# Patient Record
Sex: Female | Born: 2000 | Race: Black or African American | Hispanic: No | Marital: Single | Smoking: Never smoker
Health system: Southern US, Community
[De-identification: ages and names within clinical notes are randomized; demographics above are authoritative.]

## PROBLEM LIST (undated history)

## (undated) DIAGNOSIS — N926 Irregular menstruation, unspecified: Principal | ICD-10-CM

## (undated) DIAGNOSIS — Z975 Presence of (intrauterine) contraceptive device: Secondary | ICD-10-CM

## (undated) DIAGNOSIS — O139 Gestational [pregnancy-induced] hypertension without significant proteinuria, unspecified trimester: Secondary | ICD-10-CM

## (undated) HISTORY — DX: Irregular menstruation, unspecified: N92.6

## (undated) HISTORY — DX: Presence of (intrauterine) contraceptive device: Z97.5

## (undated) HISTORY — PX: NO PAST SURGERIES: SHX2092

---
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
## 2019-09-22 ENCOUNTER — Encounter: Payer: Self-pay | Admitting: Adult Health

## 2019-09-22 ENCOUNTER — Other Ambulatory Visit (HOSPITAL_COMMUNITY)
Admission: RE | Admit: 2019-09-22 | Discharge: 2019-09-22 | Disposition: A | Discharge status: Home / Self Care | Payer: Medicaid Other | Source: Ambulatory Visit | Attending: Adult Health | Admitting: Adult Health

## 2019-09-22 ENCOUNTER — Ambulatory Visit (INDEPENDENT_AMBULATORY_CARE_PROVIDER_SITE_OTHER): Payer: Medicaid Other | Admitting: Adult Health

## 2019-09-22 ENCOUNTER — Other Ambulatory Visit: Payer: Self-pay

## 2019-09-22 VITALS — BP 127/72 | HR 72 | Ht 63.0 in | Wt 221.5 lb

## 2019-09-22 DIAGNOSIS — Z113 Encounter for screening for infections with a predominantly sexual mode of transmission: Secondary | ICD-10-CM | POA: Insufficient documentation

## 2019-09-22 NOTE — Progress Notes (Signed)
  Subjective:     Patient ID: Brittany Morton, female   DOB: [birth year], 19 y.o.   MRN: [id number]  HPI Lizanne is a 19 year old black female, single, G1P1 in requesting STD screening. PCP is [facility].   Review of Systems Denies any discharge, odor, itching or burning Reviewed past medical,surgical, social and family history. Reviewed medications and allergies.     Objective:   Physical Exam BP 127/72 (BP Location: Left Arm, Patient Position: Sitting, Cuff Size: Large)   Pulse 72   Ht 5\' 3"  (1.6 m)   Wt 221 lb 8 oz (100.5 kg)   LMP 08/30/2019 (Exact Date)   BMI 39.24 kg/m   Fall risk is moderate.assisted off the exam table.  Skin warm and dry.Pelvic: external genitalia is normal in appearance no lesions, vagina: scant white discharge without odor,urethra has no lesions or masses noted, cervix:smooth and bulbous, uterus: normal size, shape and contour, non tender, no masses felt, adnexa: no masses or tenderness noted. Bladder is non tender and no masses felt. CV swab obtained. Examination chaperoned by 10/28/2019 CMA.     Assessment:     STD screening CV swab sent Check HIV and RPR     Plan:    Return 10/02/2020 for first pap and physical  She declines birth control

## 2019-09-23 ENCOUNTER — Telehealth: Payer: Self-pay | Admitting: Adult Health

## 2019-09-23 ENCOUNTER — Encounter: Payer: Self-pay | Admitting: Adult Health

## 2019-09-23 DIAGNOSIS — A749 Chlamydial infection, unspecified: Secondary | ICD-10-CM

## 2019-09-23 HISTORY — DX: Chlamydial infection, unspecified: A74.9

## 2019-09-23 LAB — CERVICOVAGINAL ANCILLARY ONLY
Chlamydia: POSITIVE — AB
Comment: NEGATIVE
Comment: NEGATIVE
Comment: NORMAL
Neisseria Gonorrhea: NEGATIVE
Trichomonas: NEGATIVE

## 2019-09-23 LAB — HIV ANTIBODY (ROUTINE TESTING W REFLEX): HIV Screen 4th Generation wRfx: NONREACTIVE

## 2019-09-23 LAB — RPR: RPR Ser Ql: NONREACTIVE

## 2019-09-23 MED ORDER — AZITHROMYCIN 500 MG PO TABS: ORAL_TABLET | ORAL | 0 refills | Status: DC

## 2019-09-23 NOTE — Telephone Encounter (Signed)
Left message that CV swab +chlamydia , sent Rx in for azithromycin 500 mg #2 2 po now to [facility] and no sex, needs proof of cure in 4 weeks, call for appt, and tell partner he needs to be treated, [facility] sent

## 2019-10-21 ENCOUNTER — Other Ambulatory Visit (HOSPITAL_COMMUNITY)
Admission: RE | Admit: 2019-10-21 | Discharge: 2019-10-21 | Disposition: A | Discharge status: Home / Self Care | Payer: Medicaid Other | Source: Ambulatory Visit | Attending: Adult Health | Admitting: Adult Health

## 2019-10-21 ENCOUNTER — Encounter: Payer: Self-pay | Admitting: Adult Health

## 2019-10-21 ENCOUNTER — Ambulatory Visit (INDEPENDENT_AMBULATORY_CARE_PROVIDER_SITE_OTHER): Payer: Medicaid Other | Admitting: Adult Health

## 2019-10-21 ENCOUNTER — Other Ambulatory Visit: Payer: Self-pay

## 2019-10-21 VITALS — BP 123/86 | HR 74 | Ht 63.0 in | Wt 222.0 lb

## 2019-10-21 DIAGNOSIS — Z8619 Personal history of other infectious and parasitic diseases: Secondary | ICD-10-CM | POA: Diagnosis not present

## 2019-10-21 NOTE — Progress Notes (Signed)
  Subjective:     Patient ID: Brittany Morton, female   DOB: 04/19/2001, 19 y.o.   MRN: [id number]  HPI Brittany Morton is a 19 year old black female, single, G1P1, back in office for proof of cure for recent +chlamydia. PCP is [facility].   Review of Systems No sex since treatment  Reviewed past medical,surgical, social and family history. Reviewed medications and allergies.     Objective:   Physical Exam BP 123/86 (BP Location: Left Wrist, Patient Position: Sitting, Cuff Size: Normal)   Pulse 74   Ht 5\' 3"  (1.6 m)   Wt 222 lb (100.7 kg)   LMP 09/28/2019 (Exact Date)   BMI 39.33 kg/m   Skin warm and dry.Pelvic: external genitalia is normal in appearance no lesions, vagina: scant discharge without odor,urethra has no lesions or masses noted, cervix:smooth, uterus: normal size, shape and contour, non tender, no masses felt, adnexa: no masses or tenderness noted. Bladder is non tender and no masses felt.  CV swab obatined    Assessment:     1. History of chlamydia CV swab sent     Plan:     Return in 1 year for pap and physical If has sex use condoms

## 2019-10-24 LAB — CERVICOVAGINAL ANCILLARY ONLY
Chlamydia: NEGATIVE
Comment: NEGATIVE
Comment: NORMAL
Neisseria Gonorrhea: NEGATIVE

## 2020-06-16 IMAGING — CT CT ABD-PELV W/ CM
2 of 4 series · 17 of 46 positions shown, 19 images · IV contrast (Isovue)
Comparison: None.

CLINICAL DATA: 17 y/o F; abdominal pain with nausea, vomiting, and
diarrhea for 2 days.

EXAM:
CT ABDOMEN AND PELVIS WITH CONTRAST
TECHNIQUE: Multidetector CT imaging of the abdomen and pelvis was performed
using the standard protocol following bolus administration of
intravenous contrast.
CONTRAST:  100mL Q33D0A-REE IOPAMIDOL (Q33D0A-REE) INJECTION 61%

[Series 2: axial st · axial · 0.75mm/px · z∈[-509,-84]mm · 14 of 93 slices shown, 16 images]
[im 4/93  soft-tissue]
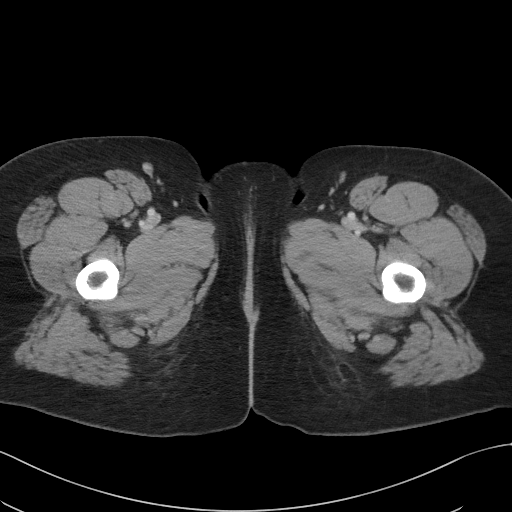
[im 4/93  bone]
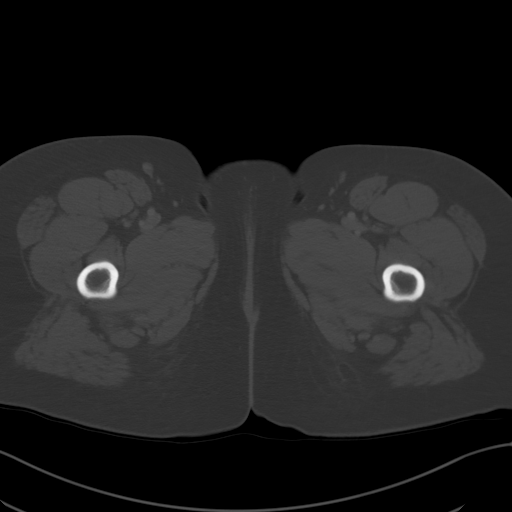
[im 12/93  soft-tissue]
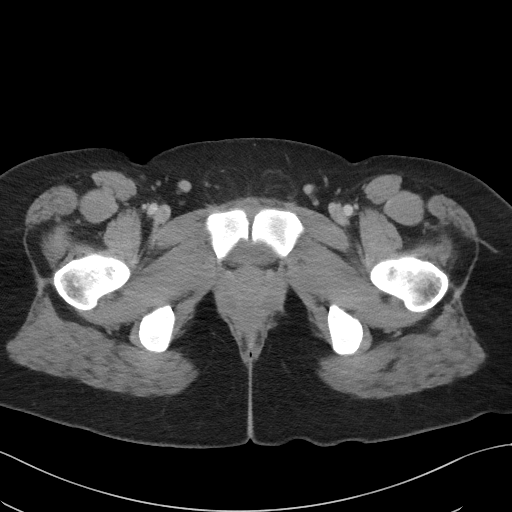
[im 19/93  soft-tissue]
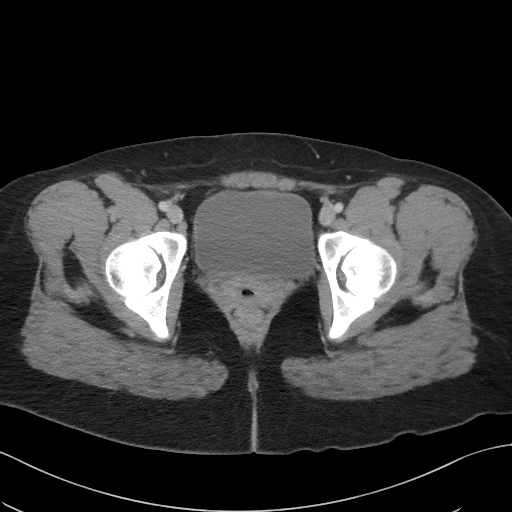
[im 26/93  soft-tissue]
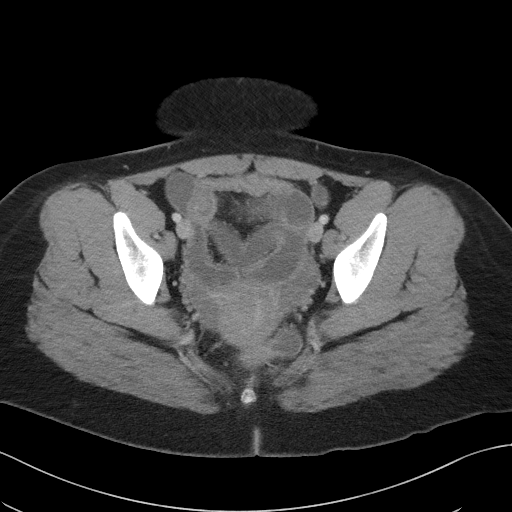
[im 30/93  soft-tissue]
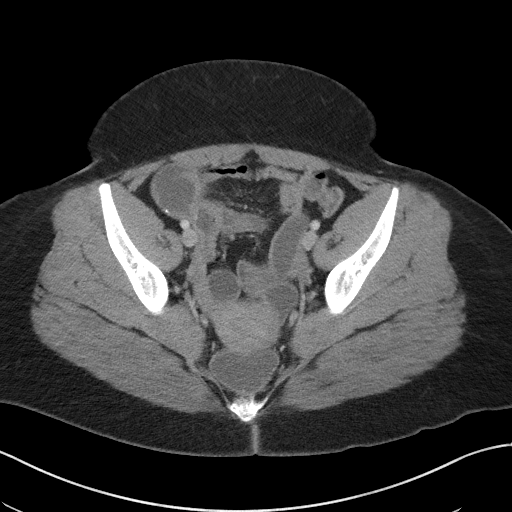
[im 37/93  soft-tissue]
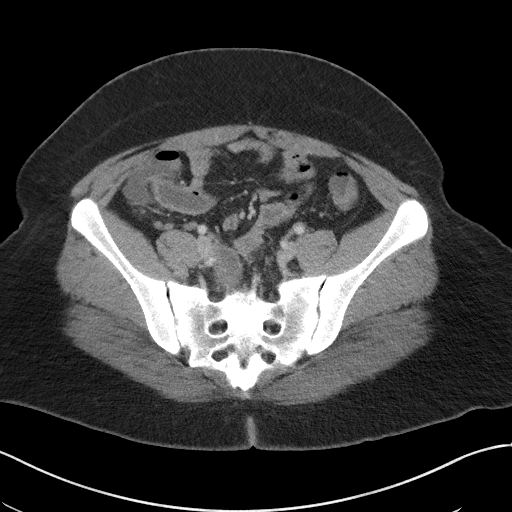
[im 45/93  soft-tissue]
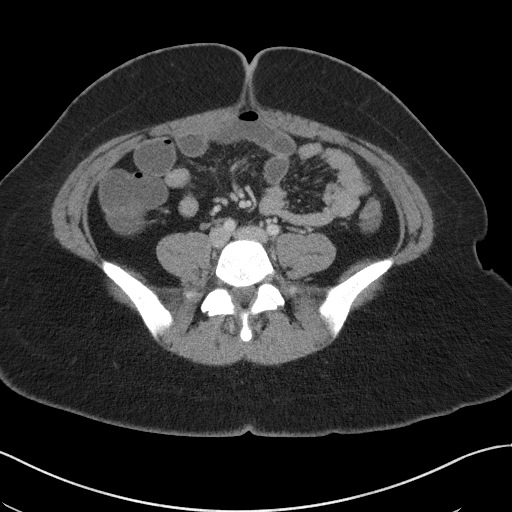
[im 48/93  soft-tissue]
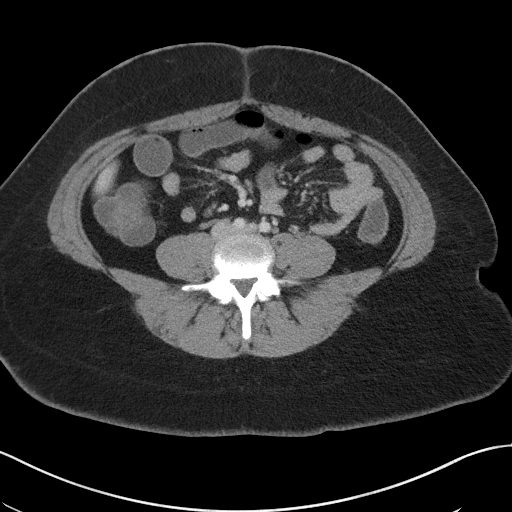
[im 56/93  soft-tissue]
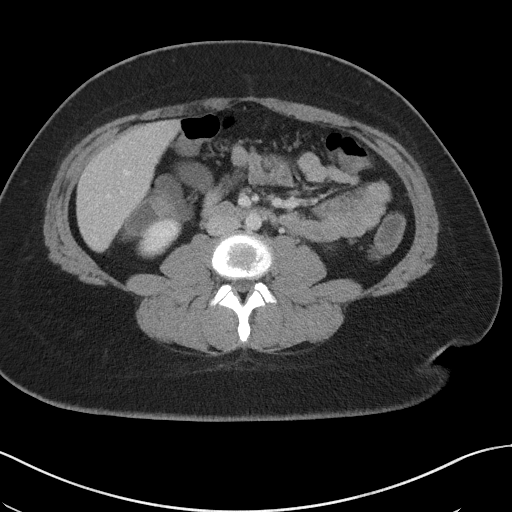
[im 56/93  bone]
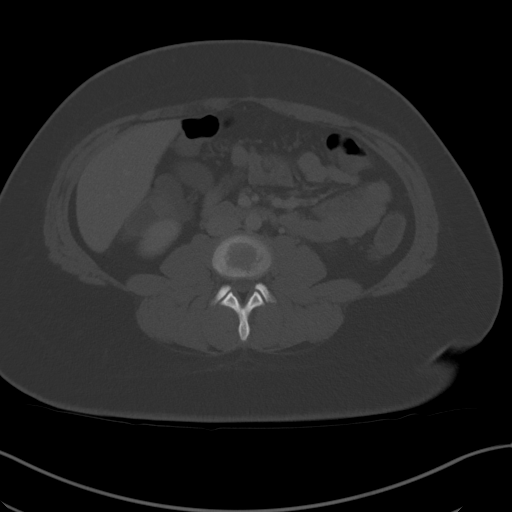
[im 63/93  soft-tissue]
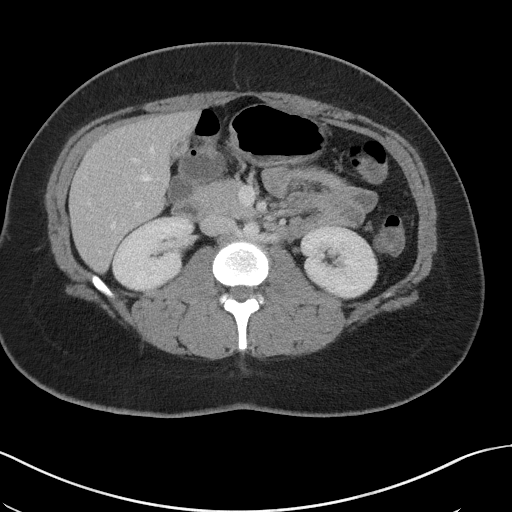
[im 70/93  soft-tissue]
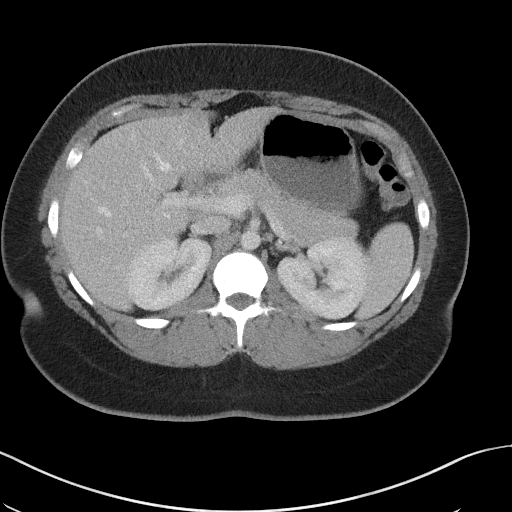
[im 74/93  soft-tissue]
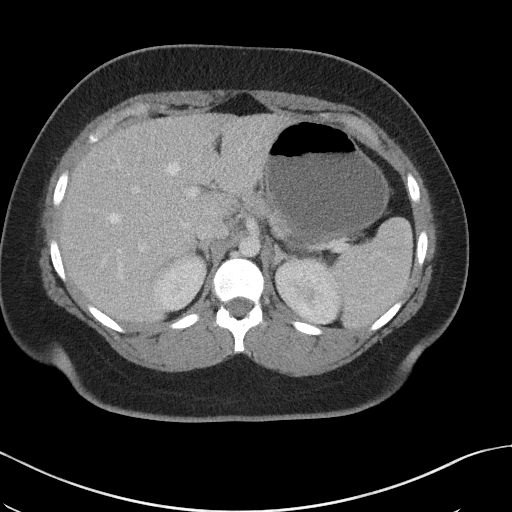
[im 81/93  soft-tissue]
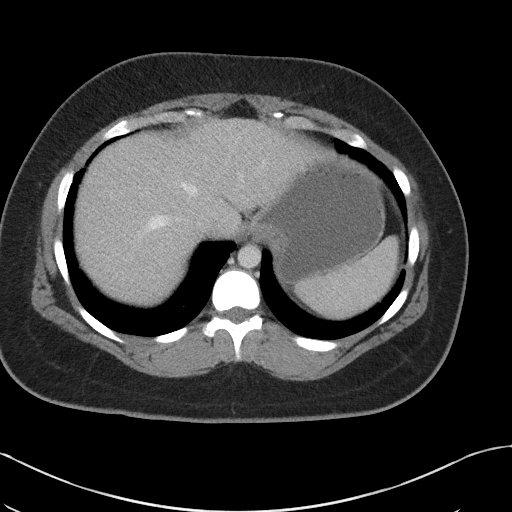
[im 89/93  soft-tissue]
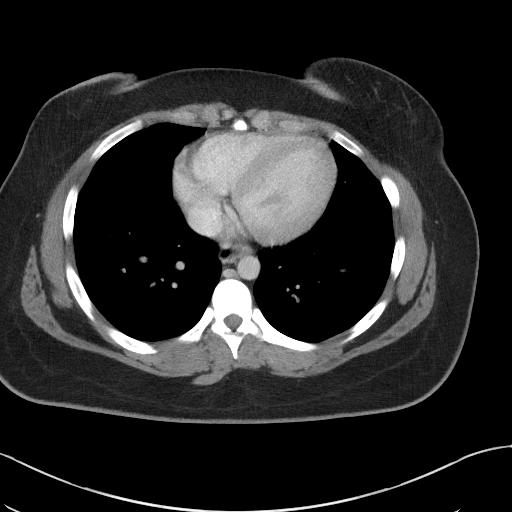

[Series 5: coronal st · coronal · 0.81mm/px · 3 of 98 slices shown]
[im 33/98  soft-tissue]
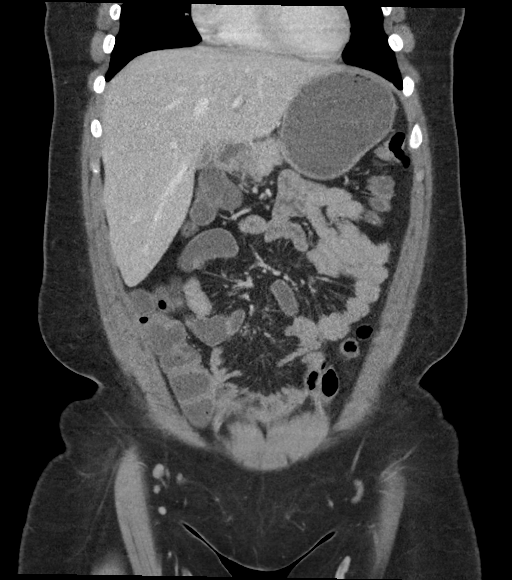
[im 44/98  soft-tissue]
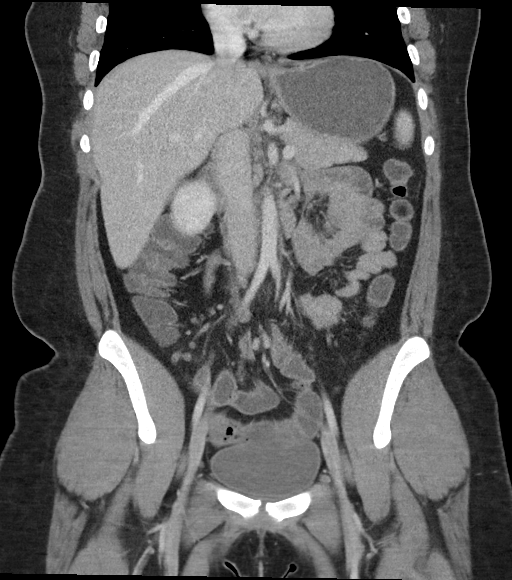
[im 54/98  soft-tissue]
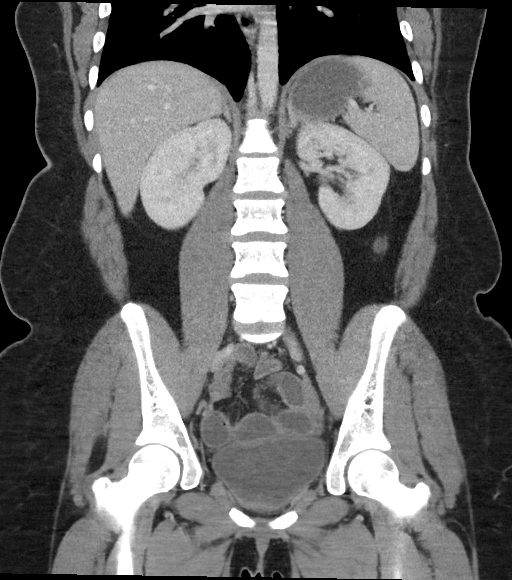

[17 of 46 positions shown; findings below may reference images not displayed]

FINDINGS: Lower chest: No acute abnormality.

Hepatobiliary: No focal liver abnormality is seen. No gallstones,
gallbladder wall thickening, or biliary dilatation.

Pancreas: Unremarkable. No pancreatic ductal dilatation or
surrounding inflammatory changes.

Spleen: Normal in size without focal abnormality.

Adrenals/Urinary Tract: Adrenal glands are unremarkable. Kidneys are
normal, without renal calculi, focal lesion, or hydronephrosis.
Bladder is unremarkable. Retroaortic left renal vein.

Stomach/Bowel: Stomach is within normal limits. Appendix appears
normal. No evidence of bowel wall thickening, distention, or
inflammatory changes.

Vascular/Lymphatic: No significant vascular findings are present. No
enlarged abdominal or pelvic lymph nodes.

Reproductive: Uterus and bilateral adnexa are unremarkable.

Other: No abdominal wall hernia or abnormality. No abdominopelvic
ascites.

Musculoskeletal: No acute or significant osseous findings.
IMPRESSION: No acute process identified.  Unremarkable CT of abdomen and pelvis.

## 2020-09-24 ENCOUNTER — Other Ambulatory Visit: Payer: Medicaid Other | Admitting: Advanced Practice Midwife

## 2020-10-08 ENCOUNTER — Other Ambulatory Visit: Payer: Self-pay

## 2020-10-08 ENCOUNTER — Encounter: Payer: Self-pay | Admitting: Advanced Practice Midwife

## 2020-10-08 ENCOUNTER — Ambulatory Visit (INDEPENDENT_AMBULATORY_CARE_PROVIDER_SITE_OTHER): Payer: Medicaid Other | Admitting: Advanced Practice Midwife

## 2020-10-08 ENCOUNTER — Other Ambulatory Visit (HOSPITAL_COMMUNITY)
Admission: RE | Admit: 2020-10-08 | Discharge: 2020-10-08 | Disposition: A | Discharge status: Home / Self Care | Payer: Medicaid Other | Source: Ambulatory Visit | Attending: Advanced Practice Midwife | Admitting: Advanced Practice Midwife

## 2020-10-08 VITALS — BP 117/74 | HR 59 | Ht 64.0 in | Wt 212.0 lb

## 2020-10-08 DIAGNOSIS — Z01419 Encounter for gynecological examination (general) (routine) without abnormal findings: Secondary | ICD-10-CM | POA: Diagnosis not present

## 2020-10-08 NOTE — Progress Notes (Signed)
Brittany Morton 20 y.o.  Vitals:   10/08/20 1105  BP: 117/74  Pulse: (!) 59     Filed Weights   10/08/20 1105  Weight: 212 lb (96.2 kg)    Past Medical History: Past Medical History:  Diagnosis Date  . Chlamydia 09/23/2019   +CHL treated 09/23/19, POC___________  . Irregular bleeding 05/22/2015  . Pregnancy induced hypertension     Past Surgical History: Past Surgical History:  Procedure Laterality Date  . NO PAST SURGERIES      Family History: Family History  Problem Relation Age of Onset  . Cancer Maternal Grandmother   . Cancer Maternal Grandfather   . Cancer Other        pt's fathers great aunts    Social History: Social History   Tobacco Use  . Smoking status: Never Smoker  . Smokeless tobacco: Never Used  Vaping Use  . Vaping Use: Never used  Substance Use Topics  . Alcohol use: Yes  . Drug use: Yes    Types: Marijuana    Comment: daily    Allergies:  Allergies  Allergen Reactions  . Penicillins Other (See Comments)    Skin peels Has patient had a PCN reaction causing immediate rash, facial/tongue/throat swelling, SOB or lightheadedness with hypotension: No Has patient had a PCN reaction causing severe rash involving mucus membranes or skin necrosis: Yes Has patient had a PCN reaction that required hospitalization: No Has patient had a PCN reaction occurring within the last 10 years: No If all of the above answers are "NO", then may proceed with Cephalosporin use.     No current outpatient medications on file.  History of Present Illness: Here for pap/physical. Not on BC, periods are fine, declines for now.  No C/O    Review of Systems   Patient denies any headaches, blurred vision, shortness of breath, chest pain, abdominal pain, problems with bowel movements, urination, or intercourse.   Physical Exam: General:  Well developed, well nourished, no acute distress Skin:  Warm and dry Neck:  Midline trachea, normal thyroid Lungs; Clear  to auscultation bilaterally Breast:  No dominant palpable mass, retraction, or nipple discharge.  Pea sized nodule in right armpit, not noticed by pt, not tender Cardiovascular: Regular rate and rhythm Abdomen:  Soft, non tender, no hepatosplenomegaly Pelvic:  External genitalia is normal in appearance.  The vagina is normal in appearance.  The cervix is bulbous.  Uterus is felt to be normal size, shape, and contour.  No adnexal masses or tenderness noted.  Extremities:  No swelling or varicosities noted Psych:  No mood changes.     Impression: Normal GYN exam.      Plan: If pap normal, repeat in 3 years GC/CHL/Trich testing today. If nodule still + under right arm in 6-8 weeks, or gets larger, let me know.

## 2020-10-10 LAB — CYTOLOGY - PAP
Chlamydia: POSITIVE — AB
Comment: NEGATIVE
Comment: NORMAL
Neisseria Gonorrhea: NEGATIVE

## 2020-10-11 ENCOUNTER — Other Ambulatory Visit: Payer: Self-pay | Admitting: Advanced Practice Midwife

## 2020-10-11 ENCOUNTER — Telehealth: Payer: Self-pay | Admitting: Advanced Practice Midwife

## 2020-10-11 ENCOUNTER — Encounter: Payer: Self-pay | Admitting: Advanced Practice Midwife

## 2020-10-11 DIAGNOSIS — R87612 Low grade squamous intraepithelial lesion on cytologic smear of cervix (LGSIL): Secondary | ICD-10-CM | POA: Insufficient documentation

## 2020-10-11 MED ORDER — AZITHROMYCIN 500 MG PO TABS: 1000.0000 mg | ORAL_TABLET | Freq: Once | ORAL | 0 refills | Status: AC

## 2020-10-11 NOTE — Progress Notes (Signed)
Azithromycin 1gm for CHL

## 2020-10-11 NOTE — Telephone Encounter (Signed)
Patient called stating that she had a pap and physical done on 10/08/2020 and she would like to know the results. Please contact pt

## 2020-11-05 ENCOUNTER — Encounter: Payer: Self-pay | Admitting: Adult Health

## 2020-11-05 ENCOUNTER — Other Ambulatory Visit: Payer: Self-pay

## 2020-11-05 ENCOUNTER — Other Ambulatory Visit (HOSPITAL_COMMUNITY)
Admission: RE | Admit: 2020-11-05 | Discharge: 2020-11-05 | Disposition: A | Discharge status: Home / Self Care | Payer: Medicaid Other | Source: Ambulatory Visit | Attending: Adult Health | Admitting: Adult Health

## 2020-11-05 ENCOUNTER — Ambulatory Visit (INDEPENDENT_AMBULATORY_CARE_PROVIDER_SITE_OTHER): Payer: Medicaid Other | Admitting: Adult Health

## 2020-11-05 VITALS — BP 114/84 | HR 73 | Ht 64.0 in | Wt 222.0 lb

## 2020-11-05 DIAGNOSIS — Z113 Encounter for screening for infections with a predominantly sexual mode of transmission: Secondary | ICD-10-CM | POA: Insufficient documentation

## 2020-11-05 DIAGNOSIS — A749 Chlamydial infection, unspecified: Secondary | ICD-10-CM | POA: Diagnosis not present

## 2020-11-05 NOTE — Progress Notes (Signed)
  Subjective:     Patient ID: Brittany Morton, female   DOB: Jul 07, 2001, 20 y.o.   MRN: [id number]  HPI Brittany Morton is a 20 year old black female, single, G1P1 in for proof of treatment for recent +chlamydia. She is CNA now.  PCP is [facility]  Review of Systems Denies any discharge, odor, itching or burning Has not had sex since treatment  Reviewed past medical,surgical, social and family history. Reviewed medications and allergies.     Objective:   Physical Exam BP 114/84 (BP Location: Left Arm, Patient Position: Sitting, Cuff Size: Large)   Pulse 73   Ht 5\' 4"  (1.626 m)   Wt 222 lb (100.7 kg)   LMP 10/18/2020   BMI 38.11 kg/m  Skin warm and dry.Pelvic: external genitalia is normal in appearance no lesions, vagina: scant discharge without odor,urethra has no lesions or masses noted, cervix:smooth and bulbous, uterus: normal size, shape and contour, non tender, no masses felt, adnexa: no masses or tenderness noted. Bladder is non tender and no masses felt. CV swab obtained.   Examination chaperoned by [facility] RN. PHQ 2 score is 0  Upstream - 11/05/20 1003      Pregnancy Intention Screening   Does the patient want to become pregnant in the next year? No    Does the patient's partner want to become pregnant in the next year? No    Would the patient like to discuss contraceptive options today? N/A      Contraception Wrap Up   Current Method Female Condom    End Method Female Condom    Contraception Counseling Provided No          Assessment:     1. Chlamydia - Cervicovaginal ancillary only,check GC/CHL and trich  2. Screening for STD (sexually transmitted disease) - Cervicovaginal ancillary only - HIV Antibody (routine testing w rflx) - RPR    Plan:    CV swab sent Check HIV and RPR Use condoms 100% Stop THC use  Follow up prn

## 2020-11-06 LAB — HIV ANTIBODY (ROUTINE TESTING W REFLEX): HIV Screen 4th Generation wRfx: NONREACTIVE

## 2020-11-06 LAB — CERVICOVAGINAL ANCILLARY ONLY
Chlamydia: NEGATIVE
Comment: NEGATIVE
Comment: NEGATIVE
Comment: NORMAL
Neisseria Gonorrhea: NEGATIVE
Trichomonas: NEGATIVE

## 2020-11-06 LAB — RPR: RPR Ser Ql: NONREACTIVE

## 2020-12-07 DIAGNOSIS — Z20828 Contact with and (suspected) exposure to other viral communicable diseases: Secondary | ICD-10-CM | POA: Diagnosis not present

## 2020-12-21 ENCOUNTER — Other Ambulatory Visit: Payer: Self-pay

## 2020-12-21 ENCOUNTER — Encounter (HOSPITAL_COMMUNITY): Payer: Self-pay | Admitting: *Deleted

## 2020-12-21 ENCOUNTER — Emergency Department (HOSPITAL_COMMUNITY)
Admission: EM | Admit: 2020-12-21 | Discharge: 2020-12-21 | Disposition: A | Discharge status: Home / Self Care | Payer: Medicaid Other | Attending: Emergency Medicine | Admitting: Emergency Medicine

## 2020-12-21 DIAGNOSIS — J020 Streptococcal pharyngitis: Secondary | ICD-10-CM | POA: Diagnosis not present

## 2020-12-21 DIAGNOSIS — Z20822 Contact with and (suspected) exposure to covid-19: Secondary | ICD-10-CM | POA: Insufficient documentation

## 2020-12-21 DIAGNOSIS — J029 Acute pharyngitis, unspecified: Secondary | ICD-10-CM | POA: Diagnosis present

## 2020-12-21 LAB — GROUP A STREP BY PCR: Group A Strep by PCR: DETECTED — AB

## 2020-12-21 LAB — RESP PANEL BY RT-PCR (FLU A&B, COVID) ARPGX2
Influenza A by PCR: NEGATIVE
Influenza B by PCR: NEGATIVE
SARS Coronavirus 2 by RT PCR: NEGATIVE

## 2020-12-21 MED ORDER — AZITHROMYCIN 250 MG PO TABS
500.0000 mg | ORAL_TABLET | Freq: Once | ORAL | Status: AC
  Administered 2020-12-21: 500 mg via ORAL
  Filled 2020-12-21: qty 2

## 2020-12-21 MED ORDER — LIDOCAINE VISCOUS HCL 2 % MT SOLN
15.0000 mL | Freq: Once | OROMUCOSAL | Status: AC
  Administered 2020-12-21: 15 mL via OROMUCOSAL
  Filled 2020-12-21: qty 15

## 2020-12-21 MED ORDER — AZITHROMYCIN 250 MG PO TABS: 500.0000 mg | ORAL_TABLET | Freq: Every day | ORAL | 0 refills | Status: AC

## 2020-12-21 MED ORDER — LIDOCAINE VISCOUS HCL 2 % MT SOLN: 10.0000 mL | Freq: Four times a day (QID) | OROMUCOSAL | 0 refills | Status: DC | PRN

## 2020-12-21 NOTE — ED Triage Notes (Signed)
Pt c/o sore throat x 2-3 days and today she noticed white patches at back of throat

## 2020-12-21 NOTE — ED Provider Notes (Signed)
[facility] EMERGENCY DEPARTMENT Provider Note   CSN: [id number] Arrival date & time: 12/21/20  2040     History Chief Complaint  Patient presents with  . Sore Throat    Brittany Morton is a 20 y.o. female with past medical history of chlamydia.  Patient presents to the emergency department chief complaint of sore throat.  Patient has had sore throat over the last 3 to 4 days.  Sore throat has remained constant over this time.  Pain has progressively worsened.  Patient rates her pain 8/10 on the pain scale.  Pain is worse with swallowing or eating.  Patient denies any alleviating factors.  Patient noted "white patches," on her tonsils today which prompted her to come to the emergency department.  Patient reports that her sister tested positive for strep throat last week.  Patient also endorses nasal congestion and cough.  Patient reports that her cough is nonproductive and started today.  Patient denies any fevers, chills, trouble swallowing, drooling, hot potato voice, neck stiffness, neck pain, nausea, vomiting, diarrhea, ear pain, facial swelling.    HPI     Past Medical History:  Diagnosis Date  . Chlamydia 09/23/2019   +CHL treated 09/23/19, POC___________  . Irregular bleeding 05/22/2015  . Pregnancy induced hypertension     Patient Active Problem List   Diagnosis Date Noted  . Screening for STD (sexually transmitted disease) 11/05/2020  . 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 Chlamydia 09/23/2019  . Encounter for Nexplanon removal 08/26/2018  . Susceptible to varicella (non-immune), currently pregnant 10/31/2016  . 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 NO PAST SURGERIES       OB History    Gravida  1   Para  1   Term  1   Preterm  0   AB  0   Living  1     SAB  0   IAB  0   Ectopic  0   Multiple  0   Live Births  1           Family History  Problem Relation Age of Onset  . Cancer Maternal  Grandmother   . Cancer Maternal Grandfather   . Cancer Other        pt's fathers great aunts    Social History   Tobacco Use  . Smoking status: Never Smoker  . Smokeless tobacco: Never Used  Vaping Use  . Vaping Use: Never used  Substance Use Topics  . Alcohol use: Yes  . Drug use: Yes    Types: Marijuana    Comment: daily    Home Medications Prior to Admission medications   Not on File    Allergies    Penicillins  Review of Systems   Review of Systems  Constitutional: Negative for chills and fever.  HENT: Positive for congestion and sore throat. Negative for dental problem, drooling, ear discharge, ear pain, facial swelling, rhinorrhea, trouble swallowing and voice change.   Eyes: Negative for pain, discharge, redness and itching.  Respiratory: Negative for shortness of breath.   Gastrointestinal: Negative for abdominal pain, nausea and vomiting.  Psychiatric/Behavioral: Negative for confusion.    Physical Exam Updated Vital Signs BP 135/82   Pulse 87   Temp 98.7 F (37.1 C) (Oral)   Resp 20   Ht 5\' 4"  (1.626 m)   Wt 100.2 kg   SpO2 98%   BMI 37.93 kg/m  Physical Exam Vitals and nursing note reviewed.  Constitutional:      General: She is not in acute distress.    Appearance: She is not ill-appearing, toxic-appearing or diaphoretic.  HENT:     Head: Normocephalic. No raccoon eyes, abrasion, contusion, masses, right periorbital erythema, left periorbital erythema or laceration.     Jaw: No trismus or pain on movement.     Mouth/Throat:     Lips: Pink. No lesions.     Mouth: Mucous membranes are moist. No injury, lacerations, oral lesions or angioedema.     Tongue: No lesions. Tongue does not deviate from midline.     Palate: No mass.     Pharynx: Uvula midline. Oropharyngeal exudate and posterior oropharyngeal erythema present. No pharyngeal swelling or uvula swelling.     Tonsils: Tonsillar exudate present. No tonsillar abscesses. 2+ on the right. 3+  on the left.  Eyes:     General: No scleral icterus.       Right eye: No discharge.        Left eye: No discharge.  Cardiovascular:     Rate and Rhythm: Normal rate.  Pulmonary:     Effort: Pulmonary effort is normal. No respiratory distress.     Breath sounds: No stridor.  Abdominal:     General: There is no distension.     Palpations: Abdomen is soft.     Tenderness: There is no abdominal tenderness. There is no guarding or rebound.  Musculoskeletal:     Cervical back: Full passive range of motion without pain. No edema, erythema, signs of trauma, rigidity, torticollis or crepitus. No pain with movement, spinous process tenderness or muscular tenderness. Normal range of motion.  Lymphadenopathy:     Cervical: Cervical adenopathy present.     Right cervical: Superficial cervical adenopathy present.     Left cervical: Superficial cervical adenopathy present.  Skin:    General: Skin is warm and dry.     Coloration: Skin is not jaundiced or pale.  Neurological:     General: No focal deficit present.     Mental Status: She is alert.  Psychiatric:        Behavior: Behavior is cooperative.     ED Results / Procedures / Treatments   Labs (all labs ordered are listed, but only abnormal results are displayed) Labs Reviewed - No data to display  EKG None  Radiology No results found.  Procedures Procedures   Medications Ordered in ED Medications  azithromycin (ZITHROMAX) tablet 500 mg (has no administration in time range)  lidocaine (XYLOCAINE) 2 % viscous mouth solution 15 mL (15 mLs Mouth/Throat Given 12/21/20 2147)    ED Course  I have reviewed the triage vital signs and the nursing notes.  Pertinent labs & imaging results that were available during my care of the patient were reviewed by me and considered in my medical decision making (see chart for details).    MDM Rules/Calculators/A&P                          Alert 20 year old female no acute stress,  nontoxic-appearing.  Patient presents with complaint of sore throat.  Symptom has been present for the last 3 to 4 days.  Today patient noted white exudates on her tonsils.  Patient also endorses nasal congestion and nonproductive cough.  Patient denies any fever, chills, drooling, potato voice, trouble swallowing, trouble breathing, trismus.  Patient has no signs of peritonsillar abscess, Ludwig's  angina, or angioedema.  Patient has Centor score of 2 will obtain rapid strep testing and culture.  We will give patient viscous lidocaine to help with sore throat.  Patient reported improvement in her symptoms after receiving viscous lidocaine.  Patient able to eat and drink without difficulty.  Rapid strep test positive.  Patient was given first dose of azithromycin.  Patient started on azithromycin due to severe allergy to penicillin.  Patient received 5-day course of azithromycin.  Will prescribe patient with viscous lidocaine as well.  Advised to follow-up with her primary care provider if symptoms do not improve.  Patient given strict return precautions.  Patient expressed understanding of all instructions.  Brittany Morton was evaluated in Emergency Department on 12/21/2020 for the symptoms described in the history of present illness. She was evaluated in the context of the global COVID-19 pandemic, which necessitated consideration that the patient might be at risk for infection with the SARS-CoV-2 virus that causes COVID-19. Institutional protocols and algorithms that pertain to the evaluation of patients at risk for COVID-19 are in a state of rapid change based on information released by regulatory bodies including the CDC and federal and state organizations. These policies and algorithms were followed during the patient's care in the ED.   Final Clinical Impression(s) / ED Diagnoses Final diagnoses:  Strep pharyngitis    Rx / DC Orders ED Discharge Orders         Ordered    azithromycin  (ZITHROMAX) 250 MG tablet  Daily        12/21/20 2237    lidocaine (XYLOCAINE) 2 % solution  Every 6 hours PRN        12/21/20 2237           Haskel Schroeder, PA-C 12/21/20 2249    Eber Hong, MD 12/22/20 [phone]

## 2020-12-21 NOTE — Discharge Instructions (Addendum)
You came to the emergency department today to be evaluated for your sore throat.  Your rapid strep test was positive.  Due to this I have started you on the antibiotic azithromycin.  You received your first dose while in the emergency department.  Please pick up your prescription tomorrow 2 pills once a day for the next 4 days.   You have a COVID-19 and influenza test pending at this time.  Please self isolate until you have these results back.  You can find results on your [facility] MyChart.  If positive please self isolate at home for the next 7 days.  If negative there is no need to self isolate.  Get help right away if: You have new symptoms, such as vomiting, severe headache, stiff or painful neck, chest pain, or shortness of breath. You have severe throat pain, drooling, or changes in your voice. You have swelling of the neck, or the skin on the neck becomes red and tender. You have signs of dehydration, such as tiredness (fatigue), dry mouth, and decreased urination. You become increasingly sleepy, or you cannot wake up completely. Your joints become red or painful.  Marland Kitchen

## 2020-12-23 LAB — CULTURE, GROUP A STREP (THRC)

## 2020-12-24 ENCOUNTER — Telehealth: Payer: Self-pay

## 2020-12-24 LAB — CULTURE, GROUP A STREP (THRC)

## 2020-12-24 NOTE — Telephone Encounter (Signed)
Transition Care Management Follow-up Telephone Call  Date of discharge and from where: [facility] 12[facility]  How have you been since you were released from the hospital? Doing ok  Any questions or concerns? No  Items Reviewed:  Did the pt receive and understand the discharge instructions provided? Yes   Medications obtained and verified? Yes   Other? No   Any new allergies since your discharge? No   Dietary orders reviewed? Yes  Do you have support at home? Yes   Home Care and Equipment/Supplies: Were home health services ordered? not applicable If so, what is the name of the agency?   Has the agency set up a time to come to the patient's home? not applicable Were any new equipment or medical supplies ordered?  No What is the name of the medical supply agency?  Were you able to get the supplies/equipment? not applicable Do you have any questions related to the use of the equipment or supplies? No  Functional Questionnaire: (I = Independent and D = Dependent) ADLs: I  Bathing/Dressing- I  Meal Prep- I  Eating- I  Maintaining continence- I  Transferring/Ambulation- I  Managing Meds- I  Follow up appointments reviewed:   PCP Hospital f/u appt confirmed? No  No PCP  Specialist Hospital f/u appt confirmed? No  not applicable  Are transportation arrangements needed? No   If their condition worsens, is the pt aware to call PCP or go to the Emergency Dept.? Yes  Was the patient provided with contact information for the PCP's office or ED? Yes  Was to pt encouraged to call back with questions or concerns? Yes

## 2020-12-25 ENCOUNTER — Telehealth: Payer: Self-pay | Admitting: *Deleted

## 2020-12-25 NOTE — Telephone Encounter (Signed)
Post ED Visit - Positive Culture Follow-up  Culture report reviewed by antimicrobial stewardship pharmacist: [facility] []  , Pharm.D. []  Enzo Bi, .D., BCPS AQ-ID []  Celedonio Miyamoto, Pharm.D., BCPS []  [address], Pharm.D., BCPS []  Romeo, Garvin Fila.D., BCPS, AAHIVP []  , Pharm.D., BCPS, AAHIVP []  Georgina Pillion, PharmD, BCPS []  , PharmD, BCPS []  [place], PharmD, BCPS []  [address], PharmD []  , PharmD, BCPS []  Estella Husk, PharmD  Pharmacy Team []  Lysle Pearl, PharmD []  , PharmD []  Phillips Climes, PharmD []  , Rph []  Agapito Games) , PharmD []  Verlan Friends, PharmD []  , PharmD []  Mervyn Gay, PharmD []  , PharmD []  Vinnie Level, PharmD []  [facility], PharmD []  , PharmD []  Len Childs, PharmD   Positive strep culture Treated with Azithromycin, organism sensitive to the same and no further patient follow-up is required at this time.  [facility] 12/25/2020, 10:17 AM

## 2021-06-25 ENCOUNTER — Other Ambulatory Visit (HOSPITAL_COMMUNITY)
Admission: RE | Admit: 2021-06-25 | Discharge: 2021-06-25 | Disposition: A | Discharge status: Home / Self Care | Payer: Medicaid Other | Source: Ambulatory Visit | Attending: Obstetrics & Gynecology | Admitting: Obstetrics & Gynecology

## 2021-06-25 ENCOUNTER — Other Ambulatory Visit: Payer: Self-pay

## 2021-06-25 ENCOUNTER — Other Ambulatory Visit (INDEPENDENT_AMBULATORY_CARE_PROVIDER_SITE_OTHER): Payer: Medicaid Other

## 2021-06-25 DIAGNOSIS — N898 Other specified noninflammatory disorders of vagina: Secondary | ICD-10-CM | POA: Diagnosis not present

## 2021-06-25 NOTE — Progress Notes (Signed)
   NURSE VISIT- VAGINITIS/STD  SUBJECTIVE:  Brittany Morton is a 20 y.o. G1P1001 GYN patientfemale here for a vaginal swab for vaginitis screening, STD screen.  She reports the following symptoms: odor for 4 days. Denies abnormal vaginal bleeding, significant pelvic pain, fever, or UTI symptoms.  OBJECTIVE:  There were no vitals taken for this visit.  Appears well, in no apparent distress  ASSESSMENT: Vaginal swab for vaginitis screening/STD screen PLAN: Self-collected vaginal probe for Gonorrhea, Chlamydia, Trichomonas, Bacterial Vaginosis, Yeast sent to lab Treatment: to be determined once results are received Follow-up as needed if symptoms persist/worsen, or new symptoms develop  Jobe Marker  06/25/2021 4:31 PM

## 2021-06-25 NOTE — Progress Notes (Signed)
Chart reviewed for nurse visit. Agree with plan of care.  Adline Potter, NP 06/25/2021 5:00 PM

## 2021-06-27 LAB — CERVICOVAGINAL ANCILLARY ONLY
Bacterial Vaginitis (gardnerella): NEGATIVE
Chlamydia: NEGATIVE
Comment: NEGATIVE
Comment: NEGATIVE
Comment: NEGATIVE
Comment: NORMAL
Neisseria Gonorrhea: NEGATIVE
Trichomonas: POSITIVE — AB

## 2021-06-28 ENCOUNTER — Encounter: Payer: Self-pay | Admitting: Adult Health

## 2021-06-28 ENCOUNTER — Other Ambulatory Visit: Payer: Self-pay | Admitting: Adult Health

## 2021-06-28 DIAGNOSIS — A599 Trichomoniasis, unspecified: Secondary | ICD-10-CM

## 2021-06-28 HISTORY — DX: Trichomoniasis, unspecified: A59.9

## 2021-06-28 MED ORDER — METRONIDAZOLE 500 MG PO TABS: 500.0000 mg | ORAL_TABLET | Freq: Two times a day (BID) | ORAL | 0 refills | Status: DC

## 2021-06-28 NOTE — Progress Notes (Signed)
+  trich on vaginal swab, will rx flagyl

## 2021-07-23 ENCOUNTER — Other Ambulatory Visit: Payer: Medicaid Other

## 2021-07-29 ENCOUNTER — Other Ambulatory Visit: Payer: Medicaid Other

## 2021-07-30 ENCOUNTER — Other Ambulatory Visit (INDEPENDENT_AMBULATORY_CARE_PROVIDER_SITE_OTHER): Payer: Medicaid Other | Admitting: *Deleted

## 2021-07-30 ENCOUNTER — Other Ambulatory Visit: Payer: Self-pay

## 2021-07-30 ENCOUNTER — Other Ambulatory Visit (HOSPITAL_COMMUNITY)
Admission: RE | Admit: 2021-07-30 | Discharge: 2021-07-30 | Disposition: A | Discharge status: Home / Self Care | Payer: Medicaid Other | Source: Ambulatory Visit | Attending: Obstetrics & Gynecology | Admitting: Obstetrics & Gynecology

## 2021-07-30 DIAGNOSIS — Z113 Encounter for screening for infections with a predominantly sexual mode of transmission: Secondary | ICD-10-CM

## 2021-07-30 NOTE — Progress Notes (Signed)
° °  NURSE VISIT- VAGINITIS/STD/POC  SUBJECTIVE:  Brittany Morton is a 20 y.o. G1P1001 GYN patientfemale here for a vaginal swab for vaginitis screening, STD screen.  She reports the following symptoms: none for 0 days. Denies abnormal vaginal bleeding, significant pelvic pain, fever, or UTI symptoms.  OBJECTIVE:  There were no vitals taken for this visit.  Appears well, in no apparent distress  ASSESSMENT: Vaginal swab for vaginitis screening & STD screening.  PLAN: Self-collected vaginal probe for Gonorrhea, Chlamydia, Trichomonas, Bacterial Vaginosis, Yeast sent to lab Treatment: to be determined once results are received Follow-up as needed if symptoms persist/worsen, or new symptoms develop  Malachy Mood  07/30/2021 10:54 AM

## 2021-07-30 NOTE — Progress Notes (Signed)
Chart reviewed for nurse visit. Agree with plan of care.  Adline Potter, NP 07/30/2021 1:38 PM

## 2021-07-31 ENCOUNTER — Other Ambulatory Visit: Payer: Self-pay | Admitting: Adult Health

## 2021-07-31 LAB — CERVICOVAGINAL ANCILLARY ONLY
Bacterial Vaginitis (gardnerella): POSITIVE — AB
Candida Glabrata: NEGATIVE
Candida Vaginitis: POSITIVE — AB
Chlamydia: NEGATIVE
Comment: NEGATIVE
Comment: NEGATIVE
Comment: NEGATIVE
Comment: NEGATIVE
Comment: NEGATIVE
Comment: NORMAL
Neisseria Gonorrhea: NEGATIVE
Trichomonas: POSITIVE — AB

## 2021-07-31 MED ORDER — FLUCONAZOLE 150 MG PO TABS: ORAL_TABLET | ORAL | 1 refills | Status: DC

## 2021-07-31 MED ORDER — METRONIDAZOLE 500 MG PO TABS: 500.0000 mg | ORAL_TABLET | Freq: Two times a day (BID) | ORAL | 0 refills | Status: DC

## 2021-07-31 NOTE — Progress Notes (Signed)
Vaginal swab +BV,yeast and trich will rx flagyl and diflucan

## 2021-08-20 ENCOUNTER — Other Ambulatory Visit: Payer: Medicaid Other

## 2021-08-22 ENCOUNTER — Other Ambulatory Visit (HOSPITAL_COMMUNITY)
Admission: RE | Admit: 2021-08-22 | Discharge: 2021-08-22 | Disposition: A | Discharge status: Home / Self Care | Payer: Medicaid Other | Source: Ambulatory Visit | Attending: Obstetrics & Gynecology | Admitting: Obstetrics & Gynecology

## 2021-08-22 ENCOUNTER — Other Ambulatory Visit (INDEPENDENT_AMBULATORY_CARE_PROVIDER_SITE_OTHER): Payer: Medicaid Other | Admitting: *Deleted

## 2021-08-22 ENCOUNTER — Other Ambulatory Visit: Payer: Self-pay

## 2021-08-22 DIAGNOSIS — Z113 Encounter for screening for infections with a predominantly sexual mode of transmission: Secondary | ICD-10-CM | POA: Diagnosis not present

## 2021-08-22 DIAGNOSIS — A599 Trichomoniasis, unspecified: Secondary | ICD-10-CM

## 2021-08-22 DIAGNOSIS — Z8619 Personal history of other infectious and parasitic diseases: Secondary | ICD-10-CM

## 2021-08-22 NOTE — Progress Notes (Signed)
° °  NURSE VISIT- VAGINITIS/STD/POC  SUBJECTIVE:  Brittany Morton is a 21 y.o. G1P1001 GYN patientfemale here for a vaginal swab for proof of cure after treatment for Trichomonas.  She reports the following symptoms: none for 0 days. Denies abnormal vaginal bleeding, significant pelvic pain, fever, or UTI symptoms.  OBJECTIVE:  There were no vitals taken for this visit.  Appears well, in no apparent distress  ASSESSMENT: Vaginal swab for proof of cure after treatment for trichomonas  PLAN: Self-collected vaginal probe for Gonorrhea, Chlamydia, Trichomonas, Bacterial Vaginosis, Yeast sent to lab Treatment: to be determined once results are received Follow-up as needed if symptoms persist/worsen, or new symptoms develop  Annamarie Dawley  08/22/2021 10:35 AM

## 2021-08-22 NOTE — Progress Notes (Signed)
Chart reviewed for nurse visit. Agree with plan of care.  Adline Potter, NP 08/22/2021 12:41 PM

## 2021-08-23 ENCOUNTER — Other Ambulatory Visit: Payer: Self-pay | Admitting: Adult Health

## 2021-08-23 LAB — CERVICOVAGINAL ANCILLARY ONLY
Bacterial Vaginitis (gardnerella): NEGATIVE
Candida Glabrata: NEGATIVE
Candida Vaginitis: POSITIVE — AB
Chlamydia: NEGATIVE
Comment: NEGATIVE
Comment: NEGATIVE
Comment: NEGATIVE
Comment: NEGATIVE
Comment: NEGATIVE
Comment: NORMAL
Neisseria Gonorrhea: NEGATIVE
Trichomonas: NEGATIVE

## 2021-08-23 MED ORDER — FLUCONAZOLE 150 MG PO TABS: ORAL_TABLET | ORAL | 1 refills | Status: DC

## 2021-08-23 NOTE — Progress Notes (Signed)
+  yeast on vaginal swab will rx diflucan  

## 2021-08-28 ENCOUNTER — Other Ambulatory Visit: Payer: Medicaid Other

## 2021-08-30 ENCOUNTER — Other Ambulatory Visit: Payer: Medicaid Other

## 2021-11-11 ENCOUNTER — Other Ambulatory Visit: Payer: Self-pay

## 2021-11-11 ENCOUNTER — Other Ambulatory Visit (INDEPENDENT_AMBULATORY_CARE_PROVIDER_SITE_OTHER): Payer: Medicaid Other

## 2021-11-11 ENCOUNTER — Other Ambulatory Visit (HOSPITAL_COMMUNITY)
Admission: RE | Admit: 2021-11-11 | Discharge: 2021-11-11 | Disposition: A | Discharge status: Home / Self Care | Payer: Medicaid Other | Source: Ambulatory Visit | Attending: Obstetrics & Gynecology | Admitting: Obstetrics & Gynecology

## 2021-11-11 DIAGNOSIS — Z113 Encounter for screening for infections with a predominantly sexual mode of transmission: Secondary | ICD-10-CM | POA: Diagnosis not present

## 2021-11-11 NOTE — Progress Notes (Signed)
? ?  NURSE VISIT- STD ? ?SUBJECTIVE:  ?Brittany Morton is a 21 y.o. G1P1001 GYN patientfemale here for a vaginal swab for STD screen following a new sex partner.  She reports the following symptoms: none. ?Denies abnormal vaginal bleeding, significant pelvic pain, fever, or UTI symptoms. ? ?OBJECTIVE:  ?There were no vitals taken for this visit.  ?Appears well, in no apparent distress ? ?ASSESSMENT: ?Vaginal swab for STD screen ? ?PLAN: ?Self-collected vaginal probe for Gonorrhea, Chlamydia, Trichomonas sent to lab ?Treatment: to be determined once results are received ?Follow-up as needed if symptoms persist/worsen, or new symptoms develop ? ?Rogue Rafalski A Tulip Meharg  ?11/11/2021 ?3:53 PM  ?

## 2021-11-13 LAB — CERVICOVAGINAL ANCILLARY ONLY
Chlamydia: NEGATIVE
Comment: NEGATIVE
Comment: NEGATIVE
Comment: NORMAL
Neisseria Gonorrhea: NEGATIVE
Trichomonas: NEGATIVE

## 2021-11-14 ENCOUNTER — Emergency Department (HOSPITAL_COMMUNITY)
Admission: EM | Admit: 2021-11-14 | Discharge: 2021-11-14 | Disposition: A | Discharge status: Home / Self Care | Payer: Medicaid Other | Attending: Emergency Medicine | Admitting: Emergency Medicine

## 2021-11-14 ENCOUNTER — Encounter (HOSPITAL_COMMUNITY): Payer: Self-pay

## 2021-11-14 ENCOUNTER — Other Ambulatory Visit: Payer: Self-pay

## 2021-11-14 DIAGNOSIS — R519 Headache, unspecified: Secondary | ICD-10-CM | POA: Diagnosis not present

## 2021-11-14 DIAGNOSIS — Y92410 Unspecified street and highway as the place of occurrence of the external cause: Secondary | ICD-10-CM | POA: Diagnosis not present

## 2021-11-14 DIAGNOSIS — Z5321 Procedure and treatment not carried out due to patient leaving prior to being seen by health care provider: Secondary | ICD-10-CM | POA: Insufficient documentation
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
## 2021-12-31 ENCOUNTER — Encounter: Payer: Self-pay | Admitting: *Deleted

## 2022-01-01 ENCOUNTER — Other Ambulatory Visit (INDEPENDENT_AMBULATORY_CARE_PROVIDER_SITE_OTHER): Payer: Medicaid Other | Admitting: *Deleted

## 2022-01-01 ENCOUNTER — Other Ambulatory Visit (HOSPITAL_COMMUNITY)
Admission: RE | Admit: 2022-01-01 | Discharge: 2022-01-01 | Disposition: A | Discharge status: Home / Self Care | Payer: Medicaid Other | Source: Ambulatory Visit | Attending: Obstetrics & Gynecology | Admitting: Obstetrics & Gynecology

## 2022-01-01 DIAGNOSIS — Z113 Encounter for screening for infections with a predominantly sexual mode of transmission: Secondary | ICD-10-CM

## 2022-01-01 NOTE — Progress Notes (Signed)
? ?  NURSE VISIT- VAGINITIS/STD/POC ? ?SUBJECTIVE:  ?Brittany Morton is a 21 y.o. G1P1001 GYN patientfemale here for a vaginal swab for STD screen.  She reports the following symptoms: none for 0 days. ?Denies abnormal vaginal bleeding, significant pelvic pain, fever, or UTI symptoms. ? ?OBJECTIVE:  ?There were no vitals taken for this visit.  ?Appears well, in no apparent distress ? ?ASSESSMENT: ?Vaginal swab for STD screen ? ?PLAN: ?Self-collected vaginal probe for Gonorrhea, Chlamydia, Trichomonas, Bacterial Vaginosis, Yeast sent to lab ?Treatment: to be determined once results are received ?Follow-up as needed if symptoms persist/worsen, or new symptoms develop ? ?Annamarie Dawley  ?01/01/2022 ?10:43 AM  ?

## 2022-01-02 ENCOUNTER — Other Ambulatory Visit: Payer: Self-pay | Admitting: Adult Health

## 2022-01-02 LAB — CERVICOVAGINAL ANCILLARY ONLY
Bacterial Vaginitis (gardnerella): POSITIVE — AB
Candida Glabrata: NEGATIVE
Candida Vaginitis: NEGATIVE
Chlamydia: NEGATIVE
Comment: NEGATIVE
Comment: NEGATIVE
Comment: NEGATIVE
Comment: NEGATIVE
Comment: NEGATIVE
Comment: NORMAL
Neisseria Gonorrhea: NEGATIVE
Trichomonas: NEGATIVE

## 2022-01-02 MED ORDER — METRONIDAZOLE 500 MG PO TABS: 500.0000 mg | ORAL_TABLET | Freq: Two times a day (BID) | ORAL | 0 refills | Status: DC

## 2022-01-02 NOTE — Progress Notes (Signed)
+  BV on vaginal swab will  rx flagyl,no sex or alcohol during treatment  °

## 2022-01-21 ENCOUNTER — Ambulatory Visit (INDEPENDENT_AMBULATORY_CARE_PROVIDER_SITE_OTHER): Payer: Medicaid Other | Admitting: *Deleted

## 2022-01-21 VITALS — BP 121/81 | HR 71 | Wt 215.0 lb

## 2022-01-21 DIAGNOSIS — N926 Irregular menstruation, unspecified: Secondary | ICD-10-CM

## 2022-01-21 DIAGNOSIS — Z3201 Encounter for pregnancy test, result positive: Secondary | ICD-10-CM | POA: Diagnosis not present

## 2022-01-21 LAB — POCT URINE PREGNANCY: Preg Test, Ur: POSITIVE — AB

## 2022-01-21 NOTE — Progress Notes (Signed)
   NURSE VISIT- PREGNANCY CONFIRMATION   SUBJECTIVE:  Brittany Morton is a 21 y.o. G30P1001 female at [redacted]w[redacted]d by certain LMP of Patient's last menstrual period was 12/24/2021. Here for pregnancy confirmation.  Home pregnancy test: positive x 1   She reports nausea.  She is not taking prenatal vitamins.    OBJECTIVE:  BP 121/81 (BP Location: Left Arm, Patient Position: Sitting, Cuff Size: Normal)   Pulse 71   Wt 215 lb (97.5 kg)   LMP 12/24/2021   BMI 39.32 kg/m   Appears well, in no apparent distress  Results for orders placed or performed in visit on 01/21/22 (from the past 24 hour(s))  POCT urine pregnancy   Collection Time: 01/21/22  3:57 PM  Result Value Ref Range   Preg Test, Ur Positive (A) Negative    ASSESSMENT: Positive pregnancy test, [redacted]w[redacted]d by LMP    PLAN: Schedule for dating ultrasound in 4 weeks Prenatal vitamins: plans to begin OTC ASAP   Nausea medicines: not currently needed   OB packet given: Yes  Jobe Marker  01/21/2022 3:58 PM

## 2022-02-21 ENCOUNTER — Other Ambulatory Visit: Payer: Medicaid Other

## 2022-03-04 ENCOUNTER — Other Ambulatory Visit: Payer: Self-pay | Admitting: Obstetrics & Gynecology

## 2022-03-04 DIAGNOSIS — O3680X0 Pregnancy with inconclusive fetal viability, not applicable or unspecified: Secondary | ICD-10-CM

## 2022-03-05 ENCOUNTER — Ambulatory Visit (INDEPENDENT_AMBULATORY_CARE_PROVIDER_SITE_OTHER): Payer: Medicaid Other

## 2022-03-05 DIAGNOSIS — O3680X0 Pregnancy with inconclusive fetal viability, not applicable or unspecified: Secondary | ICD-10-CM | POA: Diagnosis not present

## 2022-03-05 NOTE — Progress Notes (Signed)
[place] 10+1 wks,single IUP,CRL 35.16 mm,FHR 166 bpm,normal ovaries

## 2022-03-14 ENCOUNTER — Encounter: Payer: Self-pay | Admitting: Advanced Practice Midwife

## 2022-03-14 DIAGNOSIS — Z3490 Encounter for supervision of normal pregnancy, unspecified, unspecified trimester: Secondary | ICD-10-CM | POA: Insufficient documentation

## 2022-03-18 ENCOUNTER — Other Ambulatory Visit: Payer: Self-pay | Admitting: Obstetrics & Gynecology

## 2022-03-18 DIAGNOSIS — Z3682 Encounter for antenatal screening for nuchal translucency: Secondary | ICD-10-CM

## 2022-03-19 ENCOUNTER — Other Ambulatory Visit (HOSPITAL_COMMUNITY)
Admission: RE | Admit: 2022-03-19 | Discharge: 2022-03-19 | Disposition: A | Discharge status: Home / Self Care | Payer: Medicaid Other | Source: Ambulatory Visit | Attending: Advanced Practice Midwife | Admitting: Advanced Practice Midwife

## 2022-03-19 ENCOUNTER — Ambulatory Visit (INDEPENDENT_AMBULATORY_CARE_PROVIDER_SITE_OTHER): Payer: Medicaid Other

## 2022-03-19 ENCOUNTER — Ambulatory Visit: Payer: Medicaid Other | Admitting: *Deleted

## 2022-03-19 ENCOUNTER — Ambulatory Visit (INDEPENDENT_AMBULATORY_CARE_PROVIDER_SITE_OTHER): Payer: Medicaid Other | Admitting: Advanced Practice Midwife

## 2022-03-19 ENCOUNTER — Encounter: Payer: Self-pay | Admitting: Advanced Practice Midwife

## 2022-03-19 VITALS — BP 134/88 | HR 78 | Wt 213.0 lb

## 2022-03-19 DIAGNOSIS — Z124 Encounter for screening for malignant neoplasm of cervix: Secondary | ICD-10-CM | POA: Diagnosis not present

## 2022-03-19 DIAGNOSIS — Z3480 Encounter for supervision of other normal pregnancy, unspecified trimester: Secondary | ICD-10-CM

## 2022-03-19 DIAGNOSIS — Z8759 Personal history of other complications of pregnancy, childbirth and the puerperium: Secondary | ICD-10-CM

## 2022-03-19 DIAGNOSIS — Z3481 Encounter for supervision of other normal pregnancy, first trimester: Secondary | ICD-10-CM

## 2022-03-19 DIAGNOSIS — Z3A12 12 weeks gestation of pregnancy: Secondary | ICD-10-CM

## 2022-03-19 DIAGNOSIS — Z363 Encounter for antenatal screening for malformations: Secondary | ICD-10-CM

## 2022-03-19 DIAGNOSIS — Z3682 Encounter for antenatal screening for nuchal translucency: Secondary | ICD-10-CM

## 2022-03-19 DIAGNOSIS — Z1379 Encounter for other screening for genetic and chromosomal anomalies: Secondary | ICD-10-CM

## 2022-03-19 DIAGNOSIS — Z113 Encounter for screening for infections with a predominantly sexual mode of transmission: Secondary | ICD-10-CM | POA: Diagnosis not present

## 2022-03-19 MED ORDER — BLOOD PRESSURE CUFF MISC: 1.0000 | 0 refills | Status: AC

## 2022-03-19 MED ORDER — ASPIRIN 81 MG PO CHEW: 162.0000 mg | CHEWABLE_TABLET | Freq: Every day | ORAL | 7 refills | Status: AC

## 2022-03-19 NOTE — Progress Notes (Signed)
[place] 12+1 wks,measurements c/w dates,CRL 62.47 mm,normal ovaries,NT 1.8 mm,NB present,FHR 152 bpm

## 2022-03-19 NOTE — Patient Instructions (Signed)
Brittany Morton, thank you for choosing our office today! We appreciate the opportunity to meet your healthcare needs. You may receive a short survey by mail, e-mail, or through [employer]. If you are happy with your care we would appreciate if you could take just a few minutes to complete the survey questions. We read all of your comments and take your feedback very seriously. Thank you again for choosing our office.  Center for [employer] [facility] Team at [facility]  [facility] ([address] [place], [place] [address]) Entrance C, located off of [address] Free 24/7 valet parking   Nausea & Vomiting Have saltine crackers or pretzels by your bed and eat a few bites before you raise your head out of bed in the morning Eat small frequent meals throughout the day instead of large meals Drink plenty of fluids throughout the day to stay hydrated, just don't drink a lot of fluids with your meals.  This can make your stomach fill up faster making you feel sick Do not brush your teeth right after you eat Products with real ginger are good for nausea, like ginger ale and ginger hard candy Make sure it says made with real ginger! Sucking on sour candy like lemon heads is also good for nausea If your prenatal vitamins make you nauseated, take them at night so you will sleep through the nausea Sea Bands If you feel like you need medicine for the nausea & vomiting please let us know If you are unable to keep any fluids or food down please let us know   Constipation Drink plenty of fluid, preferably water, throughout the day Eat foods high in fiber such as fruits, vegetables, and grains Exercise, such as walking, is a good way to keep your bowels regular Drink warm fluids, especially warm prune juice, or decaf coffee Eat a 1/2 cup of real oatmeal (not instant), 1/2 cup applesauce, and 1/2-1 cup warm prune juice every day If needed, you may take Colace (docusate sodium) stool softener  once or twice a day to help keep the stool soft.  If you still are having problems with constipation, you may take Miralax once daily as needed to help keep your bowels regular.   Home Blood Pressure Monitoring for Patients   Your provider has recommended that you check your blood pressure (BP) at least once a week at home. If you do not have a blood pressure cuff at home, one will be provided for you. Contact your provider if you have not received your monitor within 1 week.   Helpful Tips for Accurate Home Blood Pressure Checks  Don't smoke, exercise, or drink caffeine 30 minutes before checking your BP Use the restroom before checking your BP (a full bladder can raise your pressure) Relax in a comfortable upright chair Feet on the ground Left arm resting comfortably on a flat surface at the level of your heart Legs uncrossed Back supported Sit quietly and don't talk Place the cuff on your bare arm Adjust snuggly, so that only two fingertips can fit between your skin and the top of the cuff Check 2 readings separated by at least one minute Keep a log of your BP readings For a visual, please reference this diagram: http://ccnc.care/bpdiagram  Provider Name: [facility]     Phone: [phone]  Zone 1: ALL CLEAR  Continue to monitor your symptoms:  BP reading is less than 140 (top number) or less than 90 (bottom  number)  No right upper stomach pain No headaches or seeing spots No feeling nauseated or throwing up No swelling in face and hands  Zone 2: CAUTION Call your doctor's office for any of the following:  BP reading is greater than 140 (top number) or greater than 90 (bottom number)  Stomach pain under your ribs in the middle or right side Headaches or seeing spots Feeling nauseated or throwing up Swelling in face and hands  Zone 3: EMERGENCY  Seek immediate medical care if you have any of the following:  BP reading is greater than160 (top number) or greater than  110 (bottom number) Severe headaches not improving with Tylenol Serious difficulty catching your breath Any worsening symptoms from Zone 2    First Trimester of Pregnancy The first trimester of pregnancy is from week 1 until the end of week 12 (months 1 through 3). A week after a sperm fertilizes an egg, the egg will implant on the wall of the uterus. This embryo will begin to develop into a baby. Genes from you and your partner are forming the baby. The female genes determine whether the baby is a boy or a girl. At 6-8 weeks, the eyes and face are formed, and the heartbeat can be seen on ultrasound. At the end of 12 weeks, all the baby's organs are formed.  Now that you are pregnant, you will want to do everything you can to have a healthy baby. Two of the most important things are to get good prenatal care and to follow your health care provider's instructions. Prenatal care is all the medical care you receive before the baby's birth. This care will help prevent, find, and treat any problems during the pregnancy and childbirth. BODY CHANGES Your body goes through many changes during pregnancy. The changes vary from woman to woman.  You may gain or lose a couple of pounds at first. You may feel sick to your stomach (nauseous) and throw up (vomit). If the vomiting is uncontrollable, call your health care provider. You may tire easily. You may develop headaches that can be relieved by medicines approved by your health care provider. You may urinate more often. Painful urination may mean you have a bladder infection. You may develop heartburn as a result of your pregnancy. You may develop constipation because certain hormones are causing the muscles that push waste through your intestines to slow down. You may develop hemorrhoids or swollen, bulging veins (varicose veins). Your breasts may begin to grow larger and become tender. Your nipples may stick out more, and the tissue that surrounds them  (areola) may become darker. Your gums may bleed and may be sensitive to brushing and flossing. Dark spots or blotches (chloasma, mask of pregnancy) may develop on your face. This will likely fade after the baby is born. Your menstrual periods will stop. You may have a loss of appetite. You may develop cravings for certain kinds of food. You may have changes in your emotions from day to day, such as being excited to be pregnant or being concerned that something may go wrong with the pregnancy and baby. You may have more vivid and strange dreams. You may have changes in your hair. These can include thickening of your hair, rapid growth, and changes in texture. Some women also have hair loss during or after pregnancy, or hair that feels dry or thin. Your hair will most likely return to normal after your baby is born. WHAT TO EXPECT AT YOUR PRENATAL  VISITS During a routine prenatal visit: You will be weighed to make sure you and the baby are growing normally. Your blood pressure will be taken. Your abdomen will be measured to track your baby's growth. The fetal heartbeat will be listened to starting around week 10 or 12 of your pregnancy. Test results from any previous visits will be discussed. Your health care provider may ask you: How you are feeling. If you are feeling the baby move. If you have had any abnormal symptoms, such as leaking fluid, bleeding, severe headaches, or abdominal cramping. If you have any questions. Other tests that may be performed during your first trimester include: Blood tests to find your blood type and to check for the presence of any previous infections. They will also be used to check for low iron levels (anemia) and Rh antibodies. Later in the pregnancy, blood tests for diabetes will be done along with other tests if problems develop. Urine tests to check for infections, diabetes, or protein in the urine. An ultrasound to confirm the proper growth and development  of the baby. An amniocentesis to check for possible genetic problems. Fetal screens for spina bifida and Down syndrome. You may need other tests to make sure you and the baby are doing well. HOME CARE INSTRUCTIONS  Medicines Follow your health care provider's instructions regarding medicine use. Specific medicines may be either safe or unsafe to take during pregnancy. Take your prenatal vitamins as directed. If you develop constipation, try taking a stool softener if your health care provider approves. Diet Eat regular, well-balanced meals. Choose a variety of foods, such as meat or vegetable-based protein, fish, milk and low-fat dairy products, vegetables, fruits, and whole grain breads and cereals. Your health care provider will help you determine the amount of weight gain that is right for you. Avoid raw meat and uncooked cheese. These carry germs that can cause birth defects in the baby. Eating four or five small meals rather than three large meals a day may help relieve nausea and vomiting. If you start to feel nauseous, eating a few soda crackers can be helpful. Drinking liquids between meals instead of during meals also seems to help nausea and vomiting. If you develop constipation, eat more high-fiber foods, such as fresh vegetables or fruit and whole grains. Drink enough fluids to keep your urine clear or pale yellow. Activity and Exercise Exercise only as directed by your health care provider. Exercising will help you: Control your weight. Stay in shape. Be prepared for labor and delivery. Experiencing pain or cramping in the lower abdomen or low back is a good sign that you should stop exercising. Check with your health care provider before continuing normal exercises. Try to avoid standing for long periods of time. Move your legs often if you must stand in one place for a long time. Avoid heavy lifting. Wear low-heeled shoes, and practice good posture. You may continue to have sex  unless your health care provider directs you otherwise. Relief of Pain or Discomfort Wear a good support bra for breast tenderness.   Take warm sitz baths to soothe any pain or discomfort caused by hemorrhoids. Use hemorrhoid cream if your health care provider approves.   Rest with your legs elevated if you have leg cramps or low back pain. If you develop varicose veins in your legs, wear support hose. Elevate your feet for 15 minutes, 3-4 times a day. Limit salt in your diet. Prenatal Care Schedule your prenatal visits by the  twelfth week of pregnancy. They are usually scheduled monthly at first, then more often in the last 2 months before delivery. Write down your questions. Take them to your prenatal visits. Keep all your prenatal visits as directed by your health care provider. Safety Wear your seat belt at all times when driving. Make a list of emergency phone numbers, including numbers for family, friends, the hospital, and police and fire departments. General Tips Ask your health care provider for a referral to a local prenatal education class. Begin classes no later than at the beginning of month 6 of your pregnancy. Ask for help if you have counseling or nutritional needs during pregnancy. Your health care provider can offer advice or refer you to specialists for help with various needs. Do not use hot tubs, steam rooms, or saunas. Do not douche or use tampons or scented sanitary pads. Do not cross your legs for long periods of time. Avoid cat litter boxes and soil used by cats. These carry germs that can cause birth defects in the baby and possibly loss of the fetus by miscarriage or stillbirth. Avoid all smoking, herbs, alcohol, and medicines not prescribed by your health care provider. Chemicals in these affect the formation and growth of the baby. Schedule a dentist appointment. At home, brush your teeth with a soft toothbrush and be gentle when you floss. SEEK MEDICAL CARE IF:   You have dizziness. You have mild pelvic cramps, pelvic pressure, or nagging pain in the abdominal area. You have persistent nausea, vomiting, or diarrhea. You have a bad smelling vaginal discharge. You have pain with urination. You notice increased swelling in your face, hands, legs, or ankles. SEEK IMMEDIATE MEDICAL CARE IF:  You have a fever. You are leaking fluid from your vagina. You have spotting or bleeding from your vagina. You have severe abdominal cramping or pain. You have rapid weight gain or loss. You vomit blood or material that looks like coffee grounds. You are exposed to [place] measles and have never had them. You are exposed to fifth disease or chickenpox. You develop a severe headache. You have shortness of breath. You have any kind of trauma, such as from a fall or a car accident. Document Released: 07/29/2001 Document Revised: 12/19/2013 Document Reviewed: 06/14/2013 [facility] Patient Information 2015 [place], [place]. This information is not intended to replace advice given to you by your health care provider. Make sure you discuss any questions you have with your health care provider.

## 2022-03-19 NOTE — Progress Notes (Signed)
INITIAL OBSTETRICAL VISIT Patient name: Brittany Morton MRN [id number]  Date of birth: 2000/10/22 Chief Complaint:   Initial Prenatal Visit  History of Present Illness:   Brittany Morton is a 21 y.o. G66P1001 African-American female at 56w1dby LMP c/w u/s at 10.1 weeks with an Estimated Date of Delivery: 09/30/22 being seen today for her initial obstetrical visit.   Patient's last menstrual period was 12/24/2021. Her obstetrical history is significant for  term vag del in 2018; hx gHTN/ppHTN .   Today she reports no complaints.  Last pap Feb 2022. Results were: LSIL w/ HRHPV not done     03/19/2022    9:34 AM 11/05/2020   10:04 AM 10/08/2020   11:09 AM 10/27/2016   10:06 AM 09/30/2016   11:43 AM  Depression screen PHQ 2/9  Decreased Interest 2 0 2 0 0  Down, Depressed, Hopeless 0 0 1 0 0  PHQ - 2 Score 2 0 3 0 0  Altered sleeping 2  2 0 1  Tired, decreased energy '2  3 2 1  ' Change in appetite 0  1 0 1  Feeling bad or failure about yourself  0  1 0 0  Trouble concentrating 0  1 0 0  Moving slowly or fidgety/restless 0  0 0 0  Suicidal thoughts 0  0 0 0  PHQ-9 Score '6  11 2 3        ' 03/19/2022    9:34 AM 10/08/2020   11:10 AM  GAD 7 : Generalized Anxiety Score  Nervous, Anxious, on Edge 0 1  Control/stop worrying 2 2  Worry too much - different things 2 2  Trouble relaxing 0 1  Restless 0 1  Easily annoyed or irritable 0 3  Afraid - awful might happen 0 1  Total GAD 7 Score 4 11     Review of Systems:   Pertinent items are noted in HPI Denies cramping/contractions, leakage of fluid, vaginal bleeding, abnormal vaginal discharge w/ itching/odor/irritation, headaches, visual changes, shortness of breath, chest pain, abdominal pain, severe nausea/vomiting, or problems with urination or bowel movements unless otherwise stated above.  Pertinent History Reviewed:  Reviewed past medical,surgical, social, obstetrical and family history.  Reviewed problem list, medications  and allergies. OB History  Gravida Para Term Preterm AB Living  '2 1 1 ' 0 0 1  SAB IAB Ectopic Multiple Live Births  0 0 0 0 1    # Outcome Date GA Lbr Len/2nd Weight Sex Delivery Anes PTL Lv  2 Current           1 Term 05/05/17 337w2d5:27 / 00:29 8 lb 0.2 oz (3.634 kg) M Vag-Spont None  LIV     Complications: Gestational hypertension   Physical Assessment:   Vitals:   03/19/22 1004  BP: 134/88  Pulse: 78  Weight: 213 lb (96.6 kg)  Body mass index is 38.96 kg/m.       Physical Examination:  General appearance - well appearing, and in no distress  Mental status - alert, oriented to person, place, and time  Psych:  She has a normal mood and affect  Skin - warm and dry, normal color, no suspicious lesions noted  Chest - effort normal, all lung fields clear to auscultation bilaterally  Heart - normal rate and regular rhythm  Abdomen - soft, nontender  Extremities:  No swelling or varicosities noted  Pelvic - VULVA: normal appearing vulva with no masses, tenderness or lesions  VAGINA: normal appearing vagina with normal color and discharge, no lesions  CERVIX: normal appearing cervix without discharge or lesions, no CMT  Thin prep pap is done without HR HPV cotesting  Chaperone: Peggy Dones    TODAY'S NT [place] 12+1 wks,measurements c/w dates,CRL 62.47 mm,normal ovaries,NT 1.8 mm,NB present,FHR 152 bpm   No results found for this or any previous visit (from the past 24 hour(s)).  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    Dispense:  1 each    Refill:  0   aspirin 81 MG chewable tablet    Sig: Chew 2  tablets (162 mg total) by mouth daily.    Dispense:  60 tablet    Refill:  7    Order Specific Question:   Supervising Provider    Answer:   OZAJanyth [email or web address][[id number]] Initial labs obtained Continue prenatal vitamins Reviewed n/v relief measures and warning s/s to report Reviewed recommended weight gain based on pre-gravid BMI Encouraged well-balanced diet Genetic & carrier screening discussed: requests Panorama and NT/IT, declines Horizon (neg in 2018) Ultrasound discussed; fetal survey: requested CCNGraympleted> form faxed if has or is planning to apply for medicaid The nature of [facility] MDs and other Advanced Practice Providers was explained to patient; also emphasized that fellows, residents, and students are part of our team. Does not have home bp cuff. Office bp cuff given: no. Rx sent: yes. Check bp weekly, let us [place]ow if consistently >140/90.   Indications for ASA therapy (per uptodate) One of the following: H/O preeclampsia, especially early onset/adverse outcome Yes  Indications for early A1C (per uptodate) BMI >=25 (>=23 in Asian women) AND one of the following High-risk race/ethnicity (eg, African American, Latino, Native American, Asi[place]ayman Islandserican, Pacific Islander) Yes History of cardiovascular disease Yes (hx gHTN/ppHTN)  Follow-up: Return for 4wk LROB w 2nd IT; then 7-8wk LROB w anatomy u/s.   Orders Placed This Encounter  Procedures   Urine Culture   US [place] Comp + 14 Wk   CBC/D/Plt+RPR+Rh+ABO+RubIgG...   HgB A1c   Protein / creatinine ratio, urine   Integrated 1   Panorama Prenatal Test Full Panel   Comp Met (CMET)   POC Urinalysis Dipstick OB    [facility]Myrtis [facility]2/2023 11:05 AM

## 2022-03-20 LAB — COMPREHENSIVE METABOLIC PANEL
ALT: 42 IU/L — ABNORMAL HIGH (ref 0–32)
AST: 22 IU/L (ref 0–40)
Albumin/Globulin Ratio: 1.3 (ref 1.2–2.2)
Albumin: 4 g/dL (ref 4.0–5.0)
Alkaline Phosphatase: 63 IU/L (ref 44–121)
BUN/Creatinine Ratio: 13 (ref 9–23)
BUN: 6 mg/dL (ref 6–20)
Bilirubin Total: <0.2 mg/dL (ref 0.0–1.2)
CO2: 17 mmol/L — ABNORMAL LOW (ref 20–29)
Calcium: 9 mg/dL (ref 8.7–10.2)
Chloride: 104 mmol/L (ref 96–106)
Creatinine, Ser: 0.47 mg/dL — ABNORMAL LOW (ref 0.57–1.00)
Globulin, Total: 3 g/dL (ref 1.5–4.5)
Glucose: 72 mg/dL (ref 70–99)
Potassium: 4.3 mmol/L (ref 3.5–5.2)
Sodium: 137 mmol/L (ref 134–144)
Total Protein: 7 g/dL (ref 6.0–8.5)
eGFR: 138 mL/min/{1.73_m2} (ref >59)

## 2022-03-21 ENCOUNTER — Encounter: Payer: Self-pay | Admitting: Advanced Practice Midwife

## 2022-03-21 DIAGNOSIS — Z8759 Personal history of other complications of pregnancy, childbirth and the puerperium: Secondary | ICD-10-CM | POA: Insufficient documentation

## 2022-03-21 LAB — CBC/D/PLT+RPR+RH+ABO+RUBIGG...
Antibody Screen: NEGATIVE
Basophils Absolute: 0 10*3/uL (ref 0.0–0.2)
Basos: 0 %
EOS (ABSOLUTE): 0 10*3/uL (ref 0.0–0.4)
Eos: 0 %
HCV Ab: NONREACTIVE
HIV Screen 4th Generation wRfx: NONREACTIVE
Hematocrit: 35.4 % (ref 34.0–46.6)
Hemoglobin: 11.5 g/dL (ref 11.1–15.9)
Hepatitis B Surface Ag: NEGATIVE
Immature Grans (Abs): 0 10*3/uL (ref 0.0–0.1)
Immature Granulocytes: 0 %
Lymphocytes Absolute: 1.4 10*3/uL (ref 0.7–3.1)
Lymphs: 15 %
MCH: 25.7 pg — ABNORMAL LOW (ref 26.6–33.0)
MCHC: 32.5 g/dL (ref 31.5–35.7)
MCV: 79 fL (ref 79–97)
Monocytes Absolute: 0.5 10*3/uL (ref 0.1–0.9)
Monocytes: 5 %
Neutrophils Absolute: 7.4 10*3/uL — ABNORMAL HIGH (ref 1.4–7.0)
Neutrophils: 80 %
Platelets: 336 10*3/uL (ref 150–450)
RBC: 4.47 x10E6/uL (ref 3.77–5.28)
RDW: 13.4 % (ref 11.7–15.4)
RPR Ser Ql: NONREACTIVE
Rh Factor: POSITIVE
Rubella Antibodies, IGG: 1.66 index (ref >0.99)
WBC: 9.4 10*3/uL (ref 3.4–10.8)

## 2022-03-21 LAB — CYTOLOGY - PAP
Chlamydia: NEGATIVE
Comment: NEGATIVE
Comment: NORMAL
Diagnosis: NEGATIVE
Neisseria Gonorrhea: NEGATIVE

## 2022-03-21 LAB — INTEGRATED 1
Crown Rump Length: 62.5 mm
Gest. Age on Collection Date: 12.4 weeks
Maternal Age at EDD: 23 yr
Nuchal Translucency (NT): 1.8 mm
Number of Fetuses: 1
PAPP-A Value: 377.9 ng/mL
Weight: 213 [lb_av]

## 2022-03-21 LAB — PROTEIN / CREATININE RATIO, URINE
Creatinine, Urine: 186.2 mg/dL
Protein, Ur: 17.4 mg/dL
Protein/Creat Ratio: 93 mg/g creat (ref 0–200)

## 2022-03-21 LAB — URINE CULTURE

## 2022-03-21 LAB — HEMOGLOBIN A1C
Est. average glucose Bld gHb Est-mCnc: 103 mg/dL
Hgb A1c MFr Bld: 5.2 % (ref 4.8–5.6)

## 2022-03-21 LAB — HCV INTERPRETATION

## 2022-03-23 ENCOUNTER — Other Ambulatory Visit: Payer: Self-pay | Admitting: Advanced Practice Midwife

## 2022-03-23 ENCOUNTER — Encounter: Payer: Self-pay | Admitting: Advanced Practice Midwife

## 2022-03-23 MED ORDER — FOSFOMYCIN TROMETHAMINE 3 G PO PACK
3.0000 g | PACK | Freq: Once | ORAL | 0 refills | Status: AC
Start: 2022-03-23 — End: 2022-03-23

## 2022-03-26 LAB — PANORAMA PRENATAL TEST FULL PANEL:PANORAMA TEST PLUS 5 ADDITIONAL MICRODELETIONS: FETAL FRACTION: 2.9

## 2022-04-16 ENCOUNTER — Ambulatory Visit (INDEPENDENT_AMBULATORY_CARE_PROVIDER_SITE_OTHER): Payer: Medicaid Other | Admitting: Advanced Practice Midwife

## 2022-04-16 ENCOUNTER — Encounter: Payer: Self-pay | Admitting: Advanced Practice Midwife

## 2022-04-16 VITALS — BP 122/77 | HR 82 | Wt 218.0 lb

## 2022-04-16 DIAGNOSIS — R87612 Low grade squamous intraepithelial lesion on cytologic smear of cervix (LGSIL): Secondary | ICD-10-CM

## 2022-04-16 DIAGNOSIS — Z3480 Encounter for supervision of other normal pregnancy, unspecified trimester: Secondary | ICD-10-CM | POA: Diagnosis not present

## 2022-04-16 DIAGNOSIS — Z3A16 16 weeks gestation of pregnancy: Secondary | ICD-10-CM

## 2022-04-16 DIAGNOSIS — R8271 Bacteriuria: Secondary | ICD-10-CM | POA: Diagnosis not present

## 2022-04-16 NOTE — Progress Notes (Signed)
   LOW-RISK PREGNANCY VISIT Patient name: Brittany Morton MRN [id number]  Date of birth: 10-10-2000 Chief Complaint:   No chief complaint on file.  History of Present Illness:   Brittany Morton is a 21 y.o. G43P1001 female at [redacted]w[redacted]d with an Estimated Date of Delivery: 09/30/22 being seen today for ongoing management of a low-risk pregnancy.  Today she reports no complaints. Contractions: Not present. Vag. Bleeding: None.   . denies leaking of fluid. Review of Systems:   Pertinent items are noted in HPI Denies abnormal vaginal discharge w/ itching/odor/irritation, headaches, visual changes, shortness of breath, chest pain, abdominal pain, severe nausea/vomiting, or problems with urination or bowel movements unless otherwise stated above. Pertinent History Reviewed:  Reviewed past medical,surgical, social, obstetrical and family history.  Reviewed problem list, medications and allergies. Physical Assessment:   Vitals:   04/16/22 0951  BP: 122/77  Pulse: 82  Weight: 218 lb (98.9 kg)  Body mass index is 39.87 kg/m.        Physical Examination:   General appearance: Well appearing, and in no distress  Mental status: Alert, oriented to person, place, and time  Skin: Warm & dry  Cardiovascular: Normal heart rate noted  Respiratory: Normal respiratory effort, no distress  Abdomen: Soft, gravid, nontender  Pelvic: Cervical exam deferred         Extremities: Edema: None  Fetal Status: Fetal Heart Rate (bpm): 138        No results found for this or any previous visit (from the past 24 hour(s)).  Assessment & Plan:  1) Low-risk pregnancy G2P1001 at [redacted]w[redacted]d with an Estimated Date of Delivery: 09/30/22   2) Hx gHTN, taking bASA  3) Recent GBS bacteruria, finished abx; urine culture today   Meds: No orders of the defined types were placed in this encounter.  Labs/procedures today: 2nd IT; urine TOC  Plan:  Continue routine obstetrical care   Reviewed: Preterm labor symptoms and  general obstetric precautions including but not limited to vaginal bleeding, contractions, leaking of fluid and fetal movement were reviewed in detail with the patient.  All questions were answered. Didn't ask about home bp cuff. Check bp weekly, let us know if >140/90.   Follow-up: Return for As scheduled (visit and anatomy u/s).  Orders Placed This Encounter  Procedures   Urine Culture   INTEGRATED 2   Arabella Merles [facility] 04/16/2022 10:17 AM

## 2022-04-16 NOTE — Patient Instructions (Signed)
Brittany Morton, thank you for choosing our office today! We appreciate the opportunity to meet your healthcare needs. You may receive a short survey by mail, e-mail, or through MyChart. If you are happy with your care we would appreciate if you could take just a few minutes to complete the survey questions. We read all of your comments and take your feedback very seriously. Thank you again for choosing our office.  [facility] Team at [facility] ([address] [place], [place] [address]) Entrance C, located off of [address] Free 24/7 valet parking  Go to [email or web address] to register for FREE online childbirth classes  Call the office ([phone]) or go to [facility] if: You begin to severe cramping Your water breaks.  Sometimes it is a big gush of fluid, sometimes it is just a trickle that keeps getting your panties wet or running down your legs You have vaginal bleeding.  It is normal to have a small amount of spotting if your cervix was checked.   [place] [facility] ([facility]): [address], [phone]           [facility]: [address], [phone]                [facility] ([facility]): [address], [phone] (call to ask if accepting patients) [facility]: [address], [place], [phone]    [place] [facility]/Family Doctors [facility] ([facility]): [address], [phone] [facility]: [address], [phone] [facility]: [address], [phone]  [place] [facility]  [facility] ([facility]): [phone] [facility]: [address], [phone]   [place] [facility]: [address], [phone]  [place] [facility]  [facility]: [address], [phone]  Home Blood Pressure Monitoring for Patients   Your provider has recommended that you check your blood pressure (BP) at least once a week at home. If you do not have a blood pressure cuff at home, one will be provided for you. Contact your provider if you have not received your monitor within 1 week.   Helpful Tips for Accurate Home Blood Pressure Checks  Don't smoke, exercise, or drink caffeine 30 minutes before checking your BP Use the restroom before checking your BP (a full bladder can raise your pressure) Relax in a comfortable upright chair Feet on the ground Left arm resting comfortably on a flat surface at the level of your heart Legs uncrossed Back supported Sit quietly and don't talk Place the cuff on your bare arm Adjust snuggly, so that only two fingertips can fit between your skin and the top of the cuff Check 2 readings separated by at least one minute Keep a log of your BP readings For a visual, please reference this diagram: http://[facility].care/bpdiagram  Provider Name: [facility]     Phone: [phone]  Zone 1: ALL CLEAR  Continue to monitor your symptoms:  BP reading is less than 140 (top number) or less than 90 (bottom number)  No right upper stomach pain No headaches or seeing spots No feeling nauseated or throwing up No swelling in face and hands  Zone 2: CAUTION Call your doctor's office for any of the following:  BP reading is greater than 140 (top number) or greater than   90 (bottom number)  Stomach pain under your ribs in the middle or right side Headaches or seeing spots Feeling nauseated or throwing up Swelling in face and hands  Zone 3: EMERGENCY  Seek immediate medical care if you have any of the following:  BP reading is greater than160 (top number) or greater than 110 (bottom number) Severe headaches not improving with Tylenol Serious difficulty catching your breath Any worsening symptoms from  Zone 2     Second Trimester of Pregnancy The second trimester is from week 14 through week 27 (months 4 through 6). The second trimester is often a time when you feel your best. Your body has adjusted to being pregnant, and you begin to feel better physically. Usually, morning sickness has lessened or quit completely, you may have more energy, and you may have an increase in appetite. The second trimester is also a time when the fetus is growing rapidly. At the end of the sixth month, the fetus is about 9 inches long and weighs about 1 pounds. You will likely begin to feel the baby move (quickening) between 16 and 20 weeks of pregnancy. Body changes during your second trimester Your body continues to go through many changes during your second trimester. The changes vary from woman to woman. Your weight will continue to increase. You will notice your lower abdomen bulging out. You may begin to get stretch marks on your hips, abdomen, and breasts. You may develop headaches that can be relieved by medicines. The medicines should be approved by your health care provider. You may urinate more often because the fetus is pressing on your bladder. You may develop or continue to have heartburn as a result of your pregnancy. You may develop constipation because certain hormones are causing the muscles that push waste through your intestines to slow down. You may develop hemorrhoids or swollen, bulging veins (varicose veins). You may have back pain. This is caused by: Weight gain. Pregnancy hormones that are relaxing the joints in your pelvis. A shift in weight and the muscles that support your balance. Your breasts will continue to grow and they will continue to become tender. Your gums may bleed and may be sensitive to brushing and flossing. Dark spots or blotches (chloasma, mask of pregnancy) may develop on your face. This will likely fade after the baby is born. A dark line from your belly button to  the pubic area (linea nigra) may appear. This will likely fade after the baby is born. You may have changes in your hair. These can include thickening of your hair, rapid growth, and changes in texture. Some women also have hair loss during or after pregnancy, or hair that feels dry or thin. Your hair will most likely return to normal after your baby is born.  What to expect at prenatal visits During a routine prenatal visit: You will be weighed to make sure you and the fetus are growing normally. Your blood pressure will be taken. Your abdomen will be measured to track your baby's growth. The fetal heartbeat will be listened to. Any test results from the previous visit will be discussed.  Your health care provider may ask you: How you are feeling. If you are feeling the baby move. If you have had any abnormal symptoms, such as leaking fluid, bleeding, severe headaches, or abdominal cramping. If you are using any tobacco products, including cigarettes, chewing tobacco, and electronic cigarettes. If you have any questions.  Other tests that may be performed during   your second trimester include: Blood tests that check for: Low iron levels (anemia). High blood sugar that affects pregnant women (gestational diabetes) between 24 and 28 weeks. Rh antibodies. This is to check for a protein on red blood cells (Rh factor). Urine tests to check for infections, diabetes, or protein in the urine. An ultrasound to confirm the proper growth and development of the baby. An amniocentesis to check for possible genetic problems. Fetal screens for spina bifida and Down syndrome. HIV (human immunodeficiency virus) testing. Routine prenatal testing includes screening for HIV, unless you choose not to have this test.  Follow these instructions at home: Medicines Follow your health care provider's instructions regarding medicine use. Specific medicines may be either safe or unsafe to take during  pregnancy. Take a prenatal vitamin that contains at least 600 micrograms (mcg) of folic acid. If you develop constipation, try taking a stool softener if your health care provider approves. Eating and drinking Eat a balanced diet that includes fresh fruits and vegetables, whole grains, good sources of protein such as meat, eggs, or tofu, and low-fat dairy. Your health care provider will help you determine the amount of weight gain that is right for you. Avoid raw meat and uncooked cheese. These carry germs that can cause birth defects in the baby. If you have low calcium intake from food, talk to your health care provider about whether you should take a daily calcium supplement. Limit foods that are high in fat and processed sugars, such as fried and sweet foods. To prevent constipation: Drink enough fluid to keep your urine clear or pale yellow. Eat foods that are high in fiber, such as fresh fruits and vegetables, whole grains, and beans. Activity Exercise only as directed by your health care provider. Most women can continue their usual exercise routine during pregnancy. Try to exercise for 30 minutes at least 5 days a week. Stop exercising if you experience uterine contractions. Avoid heavy lifting, wear low heel shoes, and practice good posture. A sexual relationship may be continued unless your health care provider directs you otherwise. Relieving pain and discomfort Wear a good support bra to prevent discomfort from breast tenderness. Take warm sitz baths to soothe any pain or discomfort caused by hemorrhoids. Use hemorrhoid cream if your health care provider approves. Rest with your legs elevated if you have leg cramps or low back pain. If you develop varicose veins, wear support hose. Elevate your feet for 15 minutes, 3-4 times a day. Limit salt in your diet. Prenatal Care Write down your questions. Take them to your prenatal visits. Keep all your prenatal visits as told by your health  care provider. This is important. Safety Wear your seat belt at all times when driving. Make a list of emergency phone numbers, including numbers for family, friends, the hospital, and police and fire departments. General instructions Ask your health care provider for a referral to a local prenatal education class. Begin classes no later than the beginning of month 6 of your pregnancy. Ask for help if you have counseling or nutritional needs during pregnancy. Your health care provider can offer advice or refer you to specialists for help with various needs. Do not use hot tubs, steam rooms, or saunas. Do not douche or use tampons or scented sanitary pads. Do not cross your legs for long periods of time. Avoid cat litter boxes and soil used by cats. These carry germs that can cause birth defects in the baby and possibly loss of the   fetus by miscarriage or stillbirth. Avoid all smoking, herbs, alcohol, and unprescribed drugs. Chemicals in these products can affect the formation and growth of the baby. Do not use any products that contain nicotine or tobacco, such as cigarettes and e-cigarettes. If you need help quitting, ask your health care provider. Visit your dentist if you have not gone yet during your pregnancy. Use a soft toothbrush to brush your teeth and be gentle when you floss. Contact a health care provider if: You have dizziness. You have mild pelvic cramps, pelvic pressure, or nagging pain in the abdominal area. You have persistent nausea, vomiting, or diarrhea. You have a bad smelling vaginal discharge. You have pain when you urinate. Get help right away if: You have a fever. You are leaking fluid from your vagina. You have spotting or bleeding from your vagina. You have severe abdominal cramping or pain. You have rapid weight gain or weight loss. You have shortness of breath with chest pain. You notice sudden or extreme swelling of your face, hands, ankles, feet, or legs. You  have not felt your baby move in over an hour. You have severe headaches that do not go away when you take medicine. You have vision changes. Summary The second trimester is from week 14 through week 27 (months 4 through 6). It is also a time when the fetus is growing rapidly. Your body goes through many changes during pregnancy. The changes vary from woman to woman. Avoid all smoking, herbs, alcohol, and unprescribed drugs. These chemicals affect the formation and growth your baby. Do not use any tobacco products, such as cigarettes, chewing tobacco, and e-cigarettes. If you need help quitting, ask your health care provider. Contact your health care provider if you have any questions. Keep all prenatal visits as told by your health care provider. This is important. This information is not intended to replace advice given to you by your health care provider. Make sure you discuss any questions you have with your health care provider. Document Released: 07/29/2001 Document Revised: 01/10/2016 Document Reviewed: 10/05/2012 Elsevier Interactive Patient Education  2017 [employer]  

## 2022-04-18 LAB — INTEGRATED 2
AFP MoM: 0.72
Alpha-Fetoprotein: 20.8 ng/mL
Crown Rump Length: 62.5 mm
DIA MoM: 1.13
DIA Value: 140.8 pg/mL
Estriol, Unconjugated: 0.48 ng/mL
Gest. Age on Collection Date: 12.4 weeks
Gestational Age: 16.4 weeks
Maternal Age at EDD: 23 yr
Nuchal Translucency (NT): 1.8 mm
Nuchal Translucency MoM: 1.29
Number of Fetuses: 1
PAPP-A MoM: 0.59
PAPP-A Value: 377.9 ng/mL
Test Results:: NEGATIVE
Weight: 213 [lb_av]
Weight: 213 [lb_av]
hCG MoM: 0.83
hCG Value: 22.1 IU/mL
uE3 MoM: 0.53

## 2022-04-19 ENCOUNTER — Other Ambulatory Visit: Payer: Self-pay | Admitting: Advanced Practice Midwife

## 2022-04-19 DIAGNOSIS — R8271 Bacteriuria: Secondary | ICD-10-CM

## 2022-04-19 DIAGNOSIS — O2342 Unspecified infection of urinary tract in pregnancy, second trimester: Secondary | ICD-10-CM

## 2022-04-19 LAB — URINE CULTURE

## 2022-04-19 MED ORDER — SULFAMETHOXAZOLE-TRIMETHOPRIM 800-160 MG PO TABS: 1.0000 | ORAL_TABLET | Freq: Two times a day (BID) | ORAL | 0 refills | Status: AC

## 2022-05-03 DIAGNOSIS — Z3481 Encounter for supervision of other normal pregnancy, first trimester: Secondary | ICD-10-CM | POA: Diagnosis not present

## 2022-05-07 ENCOUNTER — Ambulatory Visit (INDEPENDENT_AMBULATORY_CARE_PROVIDER_SITE_OTHER): Payer: Medicaid Other | Admitting: Advanced Practice Midwife

## 2022-05-07 ENCOUNTER — Ambulatory Visit (INDEPENDENT_AMBULATORY_CARE_PROVIDER_SITE_OTHER): Payer: Medicaid Other

## 2022-05-07 ENCOUNTER — Encounter: Payer: Self-pay | Admitting: Advanced Practice Midwife

## 2022-05-07 VITALS — BP 119/75 | HR 75 | Wt 225.0 lb

## 2022-05-07 DIAGNOSIS — Z3480 Encounter for supervision of other normal pregnancy, unspecified trimester: Secondary | ICD-10-CM

## 2022-05-07 DIAGNOSIS — O2342 Unspecified infection of urinary tract in pregnancy, second trimester: Secondary | ICD-10-CM | POA: Diagnosis not present

## 2022-05-07 DIAGNOSIS — Z3A19 19 weeks gestation of pregnancy: Secondary | ICD-10-CM

## 2022-05-07 DIAGNOSIS — Z363 Encounter for antenatal screening for malformations: Secondary | ICD-10-CM

## 2022-05-07 DIAGNOSIS — Z8759 Personal history of other complications of pregnancy, childbirth and the puerperium: Secondary | ICD-10-CM

## 2022-05-07 NOTE — Progress Notes (Signed)
[place] 16+0 wks,cephalic,anterior fundal placenta gr 0,normal ovaries,cx 3.1 cm,SVP of fluid 5.2 cm,bilat choroid plexus cysts,complex left CPC 13 X 7 X 7 mm,right simple CPC 14 X 10 X 10 mm,FHR 152 bpm,EFW 308 g 77%,anatomy complete

## 2022-05-07 NOTE — Progress Notes (Signed)
   LOW-RISK PREGNANCY VISIT Patient name: Brittany Morton MRN [id number]  Date of birth: 2000/09/04 Chief Complaint:   Routine Prenatal Visit  History of Present Illness:   Brittany Morton is a 21 y.o. G61P1001 female at [redacted]w[redacted]d with an Estimated Date of Delivery: 09/30/22 being seen today for ongoing management of a low-risk pregnancy.  Today she reports no complaints. Contractions: Not present. Vag. Bleeding: None.  Movement: Present. denies leaking of fluid. Review of Systems:   Pertinent items are noted in HPI Denies abnormal vaginal discharge w/ itching/odor/irritation, headaches, visual changes, shortness of breath, chest pain, abdominal pain, severe nausea/vomiting, or problems with urination or bowel movements unless otherwise stated above. Pertinent History Reviewed:  Reviewed past medical,surgical, social, obstetrical and family history.  Reviewed problem list, medications and allergies. Physical Assessment:   Vitals:   05/07/22 0911  BP: 119/75  Pulse: 75  Weight: 225 lb (102.1 kg)  Body mass index is 41.15 kg/m.        Physical Examination:   General appearance: Well appearing, and in no distress  Mental status: Alert, oriented to person, place, and time  Skin: Warm & dry  Cardiovascular: Normal heart rate noted  Respiratory: Normal respiratory effort, no distress  Abdomen: Soft, gravid, nontender  Pelvic: Cervical exam deferred         Extremities: Edema: None  Fetal Status: Fetal Heart Rate (bpm): 152 u/s   Movement: Present    Anatomy u/s: [place] 29+5 wks,cephalic,anterior fundal placenta gr 0,normal ovaries,cx 3.1 cm,SVP of fluid 5.2 cm,bilat choroid plexus cysts,complex left CPC 13 X 7 X 7 mm,right simple CPC 14 X 10 X 10 mm,FHR 152 bpm,EFW 308 g 77%,anatomy complete  No results found for this or any previous visit (from the past 24 hour(s)).  Assessment & Plan:  1) Low-risk pregnancy G2P1001 at [redacted]w[redacted]d with an Estimated Date of Delivery: 09/30/22   2) Bilat CPCs,  14 & 59mm, neg genetics, f/u @ 28wks per [facility]  3) Prev UTI, TOC today   Meds: No orders of the defined types were placed in this encounter.  Labs/procedures today: anatomy u/s; urine TOC  Plan:  Continue routine obstetrical care   Reviewed: Preterm labor symptoms and general obstetric precautions including but not limited to vaginal bleeding, contractions, leaking of fluid and fetal movement were reviewed in detail with the patient.  All questions were answered. Has home bp cuff. Check bp weekly, let us know if >140/90.   Follow-up: No follow-ups on file.  Orders Placed This Encounter  Procedures   Urine Culture   [facility]rt[facility] 05/07/2022 9:40 AM

## 2022-05-07 NOTE — Patient Instructions (Signed)
Brittany Morton, thank you for choosing our office today! We appreciate the opportunity to meet your healthcare needs. You may receive a short survey by mail, e-mail, or through [employer]. If you are happy with your care we would appreciate if you could take just a few minutes to complete the survey questions. We read all of your comments and take your feedback very seriously. Thank you again for choosing our office.  Center for [employer] Team at [place] at [facility] ([place], [place] [address]) Entrance C, located off of [place] parking  Go to [email or web address] to register for FREE online childbirth classes  Call the office [phone]) or go to [facility] if: You begin to severe cramping Your water breaks.  Sometimes it is a big gush of fluid, sometimes it is just a trickle that keeps getting your panties wet or running down your legs You have vaginal bleeding.  It is normal to have a small amount of spotting if your cervix was checked.   [facility] Pediatricians/Family Doctors [facility]): [address] [place], [facility]: [address] [place], [phone]                [facility]): [place], [phone] (call to ask if accepting patients) [facility] Department: [address], [place], [place] Pediatricians/Family Doctors [facility]): [place]. [place], [address], [place] [facility]: [address] [place], [place] [facility] [place] [facility] [place]: [place], [facility]): [phone] [facility]: [address], [place]: [address] [place] [facility]: [phone], [phone]  Home Blood Pressure Monitoring for Patients   Your provider has recommended that you check your blood pressure (BP) at least once a week at home. If you do not have a blood pressure cuff at home, one will be provided for you. Contact your provider if you have not received your monitor within 1 week.   Helpful Tips for Accurate Home Blood Pressure Checks  Don't smoke, exercise, or drink caffeine 30 minutes before checking your BP Use the restroom before checking your BP (a full bladder can raise your pressure) Relax in a comfortable upright chair Feet on the ground Left arm resting comfortably on a flat surface at the level of your heart Legs uncrossed Back supported Sit quietly and don't talk Place the cuff on your bare arm Adjust snuggly, so that only two fingertips can fit between your skin and the top of the cuff Check 2 readings separated by at least one minute Keep a log of your BP readings For a visual, please reference this diagram: http://ccnc.care/bpdiagram  Provider Name: [facility]     Phone: [phone]  Zone 1: ALL CLEAR  Continue to monitor your symptoms:  BP reading is less than 140 (top number) or less than 90 (bottom number)  No right upper stomach pain No headaches or seeing spots No feeling nauseated or throwing up No swelling in face and hands  Zone 2: CAUTION Call your doctor's office for any of the following:  BP reading is greater than 140 (top number) or greater than  90 (bottom number)  Stomach pain under your ribs in the middle or right side Headaches or seeing spots Feeling nauseated or throwing up Swelling in face and hands  Zone 3: EMERGENCY  Seek immediate medical care if you have any of the following:  BP reading is greater than160 (top number) or greater than 110 (bottom number) Severe headaches not improving with Tylenol Serious difficulty catching your breath Any worsening symptoms from  Zone 2     Second Trimester of Pregnancy The second trimester is from week 14 through week 27 (months 4 through 6). The second trimester is often a time when you feel your best. Your body has adjusted to being pregnant, and you begin to feel better physically. Usually, morning sickness has lessened or quit completely, you may have more energy, and you may have an increase in appetite. The second trimester is also a time when the fetus is growing rapidly. At the end of the sixth month, the fetus is about 9 inches long and weighs about 1 pounds. You will likely begin to feel the baby move (quickening) between 16 and 20 weeks of pregnancy. Body changes during your second trimester Your body continues to go through many changes during your second trimester. The changes vary from woman to woman. Your weight will continue to increase. You will notice your lower abdomen bulging out. You may begin to get stretch marks on your hips, abdomen, and breasts. You may develop headaches that can be relieved by medicines. The medicines should be approved by your health care provider. You may urinate more often because the fetus is pressing on your bladder. You may develop or continue to have heartburn as a result of your pregnancy. You may develop constipation because certain hormones are causing the muscles that push waste through your intestines to slow down. You may develop hemorrhoids or swollen, bulging veins (varicose veins). You may have back pain. This is caused by: Weight gain. Pregnancy hormones that are relaxing the joints in your pelvis. A shift in weight and the muscles that support your balance. Your breasts will continue to grow and they will continue to become tender. Your gums may bleed and may be sensitive to brushing and flossing. Dark spots or blotches (chloasma, mask of pregnancy) may develop on your face. This will likely fade after the baby is born. A dark line from your belly button to  the pubic area (linea nigra) may appear. This will likely fade after the baby is born. You may have changes in your hair. These can include thickening of your hair, rapid growth, and changes in texture. Some women also have hair loss during or after pregnancy, or hair that feels dry or thin. Your hair will most likely return to normal after your baby is born.  What to expect at prenatal visits During a routine prenatal visit: You will be weighed to make sure you and the fetus are growing normally. Your blood pressure will be taken. Your abdomen will be measured to track your baby's growth. The fetal heartbeat will be listened to. Any test results from the previous visit will be discussed.  Your health care provider may ask you: How you are feeling. If you are feeling the baby move. If you have had any abnormal symptoms, such as leaking fluid, bleeding, severe headaches, or abdominal cramping. If you are using any tobacco products, including cigarettes, chewing tobacco, and electronic cigarettes. If you have any questions.  Other tests that may be performed during   your second trimester include: Blood tests that check for: Low iron levels (anemia). High blood sugar that affects pregnant women (gestational diabetes) between 24 and 28 weeks. Rh antibodies. This is to check for a protein on red blood cells (Rh factor). Urine tests to check for infections, diabetes, or protein in the urine. An ultrasound to confirm the proper growth and development of the baby. An amniocentesis to check for possible genetic problems. Fetal screens for spina bifida and Down syndrome. HIV (human immunodeficiency virus) testing. Routine prenatal testing includes screening for HIV, unless you choose not to have this test.  Follow these instructions at home: Medicines Follow your health care provider's instructions regarding medicine use. Specific medicines may be either safe or unsafe to take during  pregnancy. Take a prenatal vitamin that contains at least 600 micrograms (mcg) of folic acid. If you develop constipation, try taking a stool softener if your health care provider approves. Eating and drinking Eat a balanced diet that includes fresh fruits and vegetables, whole grains, good sources of protein such as meat, eggs, or tofu, and low-fat dairy. Your health care provider will help you determine the amount of weight gain that is right for you. Avoid raw meat and uncooked cheese. These carry germs that can cause birth defects in the baby. If you have low calcium intake from food, talk to your health care provider about whether you should take a daily calcium supplement. Limit foods that are high in fat and processed sugars, such as fried and sweet foods. To prevent constipation: Drink enough fluid to keep your urine clear or pale yellow. Eat foods that are high in fiber, such as fresh fruits and vegetables, whole grains, and beans. Activity Exercise only as directed by your health care provider. Most women can continue their usual exercise routine during pregnancy. Try to exercise for 30 minutes at least 5 days a week. Stop exercising if you experience uterine contractions. Avoid heavy lifting, wear low heel shoes, and practice good posture. A sexual relationship may be continued unless your health care provider directs you otherwise. Relieving pain and discomfort Wear a good support bra to prevent discomfort from breast tenderness. Take warm sitz baths to soothe any pain or discomfort caused by hemorrhoids. Use hemorrhoid cream if your health care provider approves. Rest with your legs elevated if you have leg cramps or low back pain. If you develop varicose veins, wear support hose. Elevate your feet for 15 minutes, 3-4 times a day. Limit salt in your diet. Prenatal Care Write down your questions. Take them to your prenatal visits. Keep all your prenatal visits as told by your health  care provider. This is important. Safety Wear your seat belt at all times when driving. Make a list of emergency phone numbers, including numbers for family, friends, the hospital, and police and fire departments. General instructions Ask your health care provider for a referral to a local prenatal education class. Begin classes no later than the beginning of month 6 of your pregnancy. Ask for help if you have counseling or nutritional needs during pregnancy. Your health care provider can offer advice or refer you to specialists for help with various needs. Do not use hot tubs, steam rooms, or saunas. Do not douche or use tampons or scented sanitary pads. Do not cross your legs for long periods of time. Avoid cat litter boxes and soil used by cats. These carry germs that can cause birth defects in the baby and possibly loss of the   fetus by miscarriage or stillbirth. Avoid all smoking, herbs, alcohol, and unprescribed drugs. Chemicals in these products can affect the formation and growth of the baby. Do not use any products that contain nicotine or tobacco, such as cigarettes and e-cigarettes. If you need help quitting, ask your health care provider. Visit your dentist if you have not gone yet during your pregnancy. Use a soft toothbrush to brush your teeth and be gentle when you floss. Contact a health care provider if: You have dizziness. You have mild pelvic cramps, pelvic pressure, or nagging pain in the abdominal area. You have persistent nausea, vomiting, or diarrhea. You have a bad smelling vaginal discharge. You have pain when you urinate. Get help right away if: You have a fever. You are leaking fluid from your vagina. You have spotting or bleeding from your vagina. You have severe abdominal cramping or pain. You have rapid weight gain or weight loss. You have shortness of breath with chest pain. You notice sudden or extreme swelling of your face, hands, ankles, feet, or legs. You  have not felt your baby move in over an hour. You have severe headaches that do not go away when you take medicine. You have vision changes. Summary The second trimester is from week 14 through week 27 (months 4 through 6). It is also a time when the fetus is growing rapidly. Your body goes through many changes during pregnancy. The changes vary from woman to woman. Avoid all smoking, herbs, alcohol, and unprescribed drugs. These chemicals affect the formation and growth your baby. Do not use any tobacco products, such as cigarettes, chewing tobacco, and e-cigarettes. If you need help quitting, ask your health care provider. Contact your health care provider if you have any questions. Keep all prenatal visits as told by your health care provider. This is important. This information is not intended to replace advice given to you by your health care provider. Make sure you discuss any questions you have with your health care provider. Document Released: 07/29/2001 Document Revised: 01/10/2016 Document Reviewed: 10/05/2012 Elsevier Interactive Patient Education  2017 Elsevier Inc.  

## 2022-05-09 LAB — URINE CULTURE

## 2022-06-04 ENCOUNTER — Ambulatory Visit (INDEPENDENT_AMBULATORY_CARE_PROVIDER_SITE_OTHER): Payer: Medicaid Other | Admitting: Advanced Practice Midwife

## 2022-06-04 ENCOUNTER — Encounter: Payer: Self-pay | Admitting: Advanced Practice Midwife

## 2022-06-04 VITALS — BP 122/78 | HR 82 | Wt 224.4 lb

## 2022-06-04 DIAGNOSIS — O3503X0 Maternal care for (suspected) central nervous system malformation or damage in fetus, choroid plexus cysts, not applicable or unspecified: Secondary | ICD-10-CM

## 2022-06-04 DIAGNOSIS — Z3480 Encounter for supervision of other normal pregnancy, unspecified trimester: Secondary | ICD-10-CM

## 2022-06-04 DIAGNOSIS — Z3A23 23 weeks gestation of pregnancy: Secondary | ICD-10-CM

## 2022-06-04 NOTE — Patient Instructions (Signed)
Brittany Morton, I greatly value your feedback.  If you receive a survey following your visit with [place] today, we appreciate you taking the time to fill it out.  Thanks, Derrill Memo, CNM   You will have your sugar test next visit.  Please do not eat or drink anything after midnight the night before you come, not even water.  You will be here for at least two hours.  Please make an appointment online for the bloodwork at [email or web address] for 8:30am (or as close to this as possible). Make sure you select the [facility] service center. The day of the appointment, check in with our office first, then you will go to [place] to start the sugar test.    [place]!!! It is now [place] at [facility] ([place], [place] [address]) Entrance C, located off of [place] parking  Go to [email or web address] to register for FREE online childbirth classes   Call the office [phone]) or go to [facility] if: You begin to have strong, frequent contractions Your water breaks.  Sometimes it is a big gush of fluid, sometimes it is just a trickle that keeps getting your panties wet or running down your legs You have vaginal bleeding.  It is normal to have a small amount of spotting if your cervix was checked.  You don't feel your baby moving like normal.  If you don't, get you something to eat and drink and lay down and focus on feeling your baby move.   If your baby is still not moving like normal, you should call the office or go to [place] Pediatricians/Family Doctors: [place] [phone]                [place] [phone] (usually not accepting new patients unless you have family there already, you are always welcome to call and ask)      [facility] [phone]       [facility] Pediatricians/Family Doctors:  [facility]: [phone] [facility]: [phone] [facility] [place]: [place] Doctors:  [facility]: [place] [facility]: [place]: [place]: [phone]   Home Blood Pressure Monitoring for Patients   Your provider has recommended that you check your blood pressure (BP) at least once a week at home. If you do not have a blood pressure cuff at home, one will be provided for you. Contact your provider if you have not received your monitor within 1 week.   Helpful Tips for Accurate Home Blood Pressure Checks  Don't smoke, exercise, or drink caffeine 30 minutes before checking your BP Use the restroom before checking your BP (a full bladder can raise your pressure) Relax in a comfortable upright chair Feet on the ground Left arm resting comfortably on a flat surface at the level of your heart Legs uncrossed Back supported Sit quietly and don't talk Place the cuff on your bare arm Adjust snuggly, so that only two fingertips can fit between your skin and the top of the cuff Check 2 readings separated by at least one minute Keep a log of your BP readings For a visual, please reference this diagram: http://ccnc.care/bpdiagram  Provider Name: [facility]     Phone: [phone]  Zone 1: ALL CLEAR  Continue to monitor your symptoms:  BP reading is less than 140 (top number) or less than 90 (bottom number)  No right upper stomach pain No headaches or seeing spots No feeling nauseated or throwing up No swelling in face and hands  Zone 2: CAUTION Call your doctor's office for any of the following:  BP reading is greater than 140 (top number) or greater than 90 (bottom number)  Stomach pain under your ribs in the middle or right side Headaches or seeing spots Feeling nauseated or throwing up Swelling in face and hands  Zone 3: EMERGENCY   Seek immediate medical care if you have any of the following:  BP reading is greater than160 (top number) or greater than 110 (bottom number) Severe headaches not improving with Tylenol Serious difficulty catching your breath Any worsening symptoms from Zone 2   Second Trimester of Pregnancy The second trimester is from week 13 through week 28, months 4 through 6. The second trimester is often a time when you feel your best. Your body has also adjusted to being pregnant, and you begin to feel better physically. Usually, morning sickness has lessened or quit completely, you may have more energy, and you may have an increase in appetite. The second trimester is also a time when the fetus is growing rapidly. At the end of the sixth month, the fetus is about 9 inches long and weighs about 1 pounds. You will likely begin to feel the baby move (quickening) between 18 and 20 weeks of the pregnancy. BODY CHANGES Your body goes through many changes during pregnancy. The changes vary from woman to woman.  Your weight will continue to increase. You will notice your lower abdomen bulging out. You may begin to get stretch marks on your hips, abdomen, and breasts. You may develop headaches that can be relieved by medicines approved by your health care provider. You may urinate more often because the fetus is pressing on your bladder. You may develop or continue to have heartburn as a result of your pregnancy. You may develop constipation because certain hormones are causing the muscles that push waste through your intestines to slow down. You may develop hemorrhoids or swollen, bulging veins (varicose veins). You may have back pain because of the weight gain and pregnancy hormones relaxing your joints between the bones in your pelvis and as a result of a shift in weight and the muscles that support your balance. Your breasts will continue to grow and be tender. Your gums may bleed and may be sensitive to  brushing and flossing. Dark spots or blotches (chloasma, mask of pregnancy) may develop on your face. This will likely fade after the baby is born. A dark line from your belly button to the pubic area (linea nigra) may appear. This will likely fade after the baby is born. You may have changes in your hair. These can include thickening of your hair, rapid growth, and changes in texture. Some women also have hair loss during or after pregnancy, or hair that feels dry or thin. Your hair will most likely return to normal after your baby is born. WHAT TO EXPECT AT YOUR PRENATAL VISITS During a routine prenatal visit: You will be weighed to make sure you and the fetus are growing normally. Your blood pressure will be taken. Your abdomen will be measured to track your baby's growth. The fetal heartbeat will be listened to. Any test results from the previous visit will be discussed. Your health care provider  may ask you: How you are feeling. If you are feeling the baby move. If you have had any abnormal symptoms, such as leaking fluid, bleeding, severe headaches, or abdominal cramping. If you have any questions. Other tests that may be performed during your second trimester include: Blood tests that check for: Low iron levels (anemia). Gestational diabetes (between 24 and 28 weeks). Rh antibodies. Urine tests to check for infections, diabetes, or protein in the urine. An ultrasound to confirm the proper growth and development of the baby. An amniocentesis to check for possible genetic problems. Fetal screens for spina bifida and Down syndrome. HOME CARE INSTRUCTIONS  Avoid all smoking, herbs, alcohol, and unprescribed drugs. These chemicals affect the formation and growth of the baby. Follow your health care provider's instructions regarding medicine use. There are medicines that are either safe or unsafe to take during pregnancy. Exercise only as directed by your health care provider.  Experiencing uterine cramps is a good sign to stop exercising. Continue to eat regular, healthy meals. Wear a good support bra for breast tenderness. Do not use hot tubs, steam rooms, or saunas. Wear your seat belt at all times when driving. Avoid raw meat, uncooked cheese, cat litter boxes, and soil used by cats. These carry germs that can cause birth defects in the baby. Take your prenatal vitamins. Try taking a stool softener (if your health care provider approves) if you develop constipation. Eat more high-fiber foods, such as fresh vegetables or fruit and whole grains. Drink plenty of fluids to keep your urine clear or pale yellow. Take warm sitz baths to soothe any pain or discomfort caused by hemorrhoids. Use hemorrhoid cream if your health care provider approves. If you develop varicose veins, wear support hose. Elevate your feet for 15 minutes, 3-4 times a day. Limit salt in your diet. Avoid heavy lifting, wear low heel shoes, and practice good posture. Rest with your legs elevated if you have leg cramps or low back pain. Visit your dentist if you have not gone yet during your pregnancy. Use a soft toothbrush to brush your teeth and be gentle when you floss. A sexual relationship may be continued unless your health care provider directs you otherwise. Continue to go to all your prenatal visits as directed by your health care provider. SEEK MEDICAL CARE IF:  You have dizziness. You have mild pelvic cramps, pelvic pressure, or nagging pain in the abdominal area. You have persistent nausea, vomiting, or diarrhea. You have a bad smelling vaginal discharge. You have pain with urination. SEEK IMMEDIATE MEDICAL CARE IF:  You have a fever. You are leaking fluid from your vagina. You have spotting or bleeding from your vagina. You have severe abdominal cramping or pain. You have rapid weight gain or loss. You have shortness of breath with chest pain. You notice sudden or extreme swelling  of your face, hands, ankles, feet, or legs. You have not felt your baby move in over an hour. You have severe headaches that do not go away with medicine. You have vision changes. Document Released: 07/29/2001 Document Revised: 08/09/2013 Document Reviewed: 10/05/2012 [facility] Patient Information 2015 [place], [place]. This information is not intended to replace advice given to you by your health care provider. Make sure you discuss any questions you have with your health care provider.

## 2022-06-04 NOTE — Progress Notes (Signed)
   LOW-RISK PREGNANCY VISIT Patient name: Brittany Morton MRN [id number]  Date of birth: 23-Mar-2001 Chief Complaint:   Routine Prenatal Visit  History of Present Illness:   Brittany Morton is a 21 y.o. G13P1001 female at [redacted]w[redacted]d with an Estimated Date of Delivery: 09/30/22 being seen today for ongoing management of a low-risk pregnancy.  Today she reports no complaints. Contractions: Not present. Vag. Bleeding: None.  Movement: Present. denies leaking of fluid. Review of Systems:   Pertinent items are noted in HPI Denies abnormal vaginal discharge w/ itching/odor/irritation, headaches, visual changes, shortness of breath, chest pain, abdominal pain, severe nausea/vomiting, or problems with urination or bowel movements unless otherwise stated above. Pertinent History Reviewed:  Reviewed past medical,surgical, social, obstetrical and family history.  Reviewed problem list, medications and allergies. Physical Assessment:   Vitals:   06/04/22 0837  BP: 122/78  Pulse: 82  Weight: 224 lb 6 oz (101.8 kg)  Body mass index is 41.04 kg/m.        Physical Examination:   General appearance: Well appearing, and in no distress  Mental status: Alert, oriented to person, place, and time  Skin: Warm & dry  Cardiovascular: Normal heart rate noted  Respiratory: Normal respiratory effort, no distress  Abdomen: Soft, gravid, nontender  Pelvic: Cervical exam deferred         Extremities: Edema: None  Fetal Status: Fetal Heart Rate (bpm): 156 Fundal Height: 23 cm Movement: Present    No results found for this or any previous visit (from the past 24 hour(s)).  Assessment & Plan:  1) Low-risk pregnancy G2P1001 at [redacted]w[redacted]d with an Estimated Date of Delivery: 09/30/22   2) Hx gHTN, taking bASA  3) CPC x 2, today will schedule f/u u/s ~29wks   Meds: No orders of the defined types were placed in this encounter.  Labs/procedures today: declines flu vaccine  Plan:  Continue routine obstetrical care    Reviewed: Preterm labor symptoms and general obstetric precautions including but not limited to vaginal bleeding, contractions, leaking of fluid and fetal movement were reviewed in detail with the patient.  All questions were answered. Didn't ask about home bp cuff. Check bp weekly, let us know if >140/90.   Follow-up: Return for 3 wk LROB w PN2; then 6wk LROB with f/u u/s for CP cysts.  Orders Placed This Encounter  Procedures   US OB Follow Up   [facility] 06/04/2022 8:53 AM

## 2022-06-25 ENCOUNTER — Ambulatory Visit (INDEPENDENT_AMBULATORY_CARE_PROVIDER_SITE_OTHER): Payer: Medicaid Other | Admitting: Advanced Practice Midwife

## 2022-06-25 ENCOUNTER — Other Ambulatory Visit: Payer: Medicaid Other

## 2022-06-25 VITALS — BP 122/80 | HR 91 | Wt 222.0 lb

## 2022-06-25 DIAGNOSIS — Z3480 Encounter for supervision of other normal pregnancy, unspecified trimester: Secondary | ICD-10-CM

## 2022-06-25 DIAGNOSIS — Z131 Encounter for screening for diabetes mellitus: Secondary | ICD-10-CM

## 2022-06-25 DIAGNOSIS — Z3A26 26 weeks gestation of pregnancy: Secondary | ICD-10-CM

## 2022-06-25 NOTE — Progress Notes (Signed)
   LOW-RISK PREGNANCY VISIT Patient name: Brittany Morton MRN [id number]  Date of birth: 10/23/2000 Chief Complaint:   Routine Prenatal Visit  History of Present Illness:   Brittany Morton is a 21 y.o. G40P1001 female at [redacted]w[redacted]d with an Estimated Date of Delivery: 09/30/22 being seen today for ongoing management of a low-risk pregnancy.  Today she reports fatigue. Contractions: Not present. Vag. Bleeding: None.  Movement: Present. denies leaking of fluid. Review of Systems:   Pertinent items are noted in HPI Denies abnormal vaginal discharge w/ itching/odor/irritation, headaches, visual changes, shortness of breath, chest pain, abdominal pain, severe nausea/vomiting, or problems with urination or bowel movements unless otherwise stated above. Pertinent History Reviewed:  Reviewed past medical,surgical, social, obstetrical and family history.  Reviewed problem list, medications and allergies. Physical Assessment:   Vitals:   06/25/22 0922  BP: 122/80  Pulse: 91  Weight: 222 lb (100.7 kg)  Body mass index is 40.6 kg/m.        Physical Examination:   General appearance: Well appearing, and in no distress  Mental status: Alert, oriented to person, place, and time  Skin: Warm & dry  Cardiovascular: Normal heart rate noted  Respiratory: Normal respiratory effort, no distress  Abdomen: Soft, gravid, nontender  Pelvic: Cervical exam deferred         Extremities: Edema: Trace  Fetal Status: Fetal Heart Rate (bpm): 154 Fundal Height: 27 cm Movement: Present    No results found for this or any previous visit (from the past 24 hour(s)).  Assessment & Plan:  1) Low-risk pregnancy G2P1001 at [redacted]w[redacted]d with an Estimated Date of Delivery: 09/30/22   2) Bilat CPSs, f/u already scheduled for next visit  3) Fatigue, will f/u on Hgb from labs today   Meds: No orders of the defined types were placed in this encounter.  Labs/procedures today: PN2  Plan:  Continue routine obstetrical care    Reviewed: Preterm labor symptoms and general obstetric precautions including but not limited to vaginal bleeding, contractions, leaking of fluid and fetal movement were reviewed in detail with the patient.  All questions were answered. Has home bp cuff.  Check bp weekly, let us know if >140/90.   Follow-up: Return for As scheduled.  No orders of the defined types were placed in this encounter.  Arabella Merles CNM 06/25/2022 9:59 AM

## 2022-06-25 NOTE — Patient Instructions (Signed)
Ernesta, thank you for choosing our office today! We appreciate the opportunity to meet your healthcare needs. You may receive a short survey by mail, e-mail, or through [employer]. If you are happy with your care we would appreciate if you could take just a few minutes to complete the survey questions. We read all of your comments and take your feedback very seriously. Thank you again for choosing our office.  Center for [employer] Team at [facility] at [facility] ([address] [place], [place] [address]) Entrance C, located off of [address] Free 24/7 valet parking  Go to [email or web address] to register for FREE online childbirth classes  Call the office [phone]) or go to [facility] if: You begin to severe cramping Your water breaks.  Sometimes it is a big gush of fluid, sometimes it is just a trickle that keeps getting your panties wet or running down your legs You have vaginal bleeding.  It is normal to have a small amount of spotting if your cervix was checked.   [facility] Pediatricians/Family Doctors [facility]): [address] Dr[address] Colette Ribas, [phone]           [facility]: [address], [phone]                [facility]): [address], [phone] (call to ask if accepting patients) [facility] Department: [address], [place], [phone]    [facility] Pediatricians/Family Doctors [facility]): [phone] [address], [phone] [facility]: [address] [place], [phone] [facility]: [address], [phone]  [facility] Doctors  [facility]): [phone] [facility]: [address], [phone]   [facility] Doctors [facility]: [address], [phone]  [facility] Doctors  [facility]: [phone], [phone]  Home Blood Pressure Monitoring for Patients   Your provider has recommended that you check your blood pressure (BP) at least once a week at home. If you do not have a blood pressure cuff at home, one will be provided for you. Contact your provider if you have not received your monitor within 1 week.   Helpful Tips for Accurate Home Blood Pressure Checks  Don't smoke, exercise, or drink caffeine 30 minutes before checking your BP Use the restroom before checking your BP (a full bladder can raise your pressure) Relax in a comfortable upright chair Feet on the ground Left arm resting comfortably on a flat surface at the level of your heart Legs uncrossed Back supported Sit quietly and don't talk Place the cuff on your bare arm Adjust snuggly, so that only two fingertips can fit between your skin and the top of the cuff Check 2 readings separated by at least one minute Keep a log of your BP readings For a visual, please reference this diagram: http://ccnc.care/bpdiagram  Provider Name: [facility]     Phone: [phone]  Zone 1: ALL CLEAR  Continue to monitor your symptoms:  BP reading is less than 140 (top number) or less than 90 (bottom number)  No right upper stomach pain No headaches or seeing spots No feeling nauseated or throwing up No swelling in face and hands  Zone 2: CAUTION Call your doctor's office for any of the following:  BP reading is greater than 140 (top number) or greater than  90 (bottom number)  Stomach pain under your ribs in the middle or right side Headaches or seeing spots Feeling nauseated or throwing up Swelling in face and hands  Zone 3: EMERGENCY  Seek immediate medical care if you have any of the following:  BP reading is greater than160 (top number) or greater than 110 (bottom number) Severe headaches not improving with Tylenol Serious difficulty catching your breath Any worsening symptoms from  Zone 2     Second Trimester of Pregnancy The second trimester is from week 14 through week 27 (months 4 through 6). The second trimester is often a time when you feel your best. Your body has adjusted to being pregnant, and you begin to feel better physically. Usually, morning sickness has lessened or quit completely, you may have more energy, and you may have an increase in appetite. The second trimester is also a time when the fetus is growing rapidly. At the end of the sixth month, the fetus is about 9 inches long and weighs about 1 pounds. You will likely begin to feel the baby move (quickening) between 16 and 20 weeks of pregnancy. Body changes during your second trimester Your body continues to go through many changes during your second trimester. The changes vary from woman to woman. Your weight will continue to increase. You will notice your lower abdomen bulging out. You may begin to get stretch marks on your hips, abdomen, and breasts. You may develop headaches that can be relieved by medicines. The medicines should be approved by your health care provider. You may urinate more often because the fetus is pressing on your bladder. You may develop or continue to have heartburn as a result of your pregnancy. You may develop constipation because certain hormones are causing the muscles that push waste through your intestines to slow down. You may develop hemorrhoids or swollen, bulging veins (varicose veins). You may have back pain. This is caused by: Weight gain. Pregnancy hormones that are relaxing the joints in your pelvis. A shift in weight and the muscles that support your balance. Your breasts will continue to grow and they will continue to become tender. Your gums may bleed and may be sensitive to brushing and flossing. Dark spots or blotches (chloasma, mask of pregnancy) may develop on your face. This will likely fade after the baby is born. A dark line from your belly button to  the pubic area (linea nigra) may appear. This will likely fade after the baby is born. You may have changes in your hair. These can include thickening of your hair, rapid growth, and changes in texture. Some women also have hair loss during or after pregnancy, or hair that feels dry or thin. Your hair will most likely return to normal after your baby is born.  What to expect at prenatal visits During a routine prenatal visit: You will be weighed to make sure you and the fetus are growing normally. Your blood pressure will be taken. Your abdomen will be measured to track your baby's growth. The fetal heartbeat will be listened to. Any test results from the previous visit will be discussed.  Your health care provider may ask you: How you are feeling. If you are feeling the baby move. If you have had any abnormal symptoms, such as leaking fluid, bleeding, severe headaches, or abdominal cramping. If you are using any tobacco products, including cigarettes, chewing tobacco, and electronic cigarettes. If you have any questions.  Other tests that may be performed during   your second trimester include: Blood tests that check for: Low iron levels (anemia). High blood sugar that affects pregnant women (gestational diabetes) between 24 and 28 weeks. Rh antibodies. This is to check for a protein on red blood cells (Rh factor). Urine tests to check for infections, diabetes, or protein in the urine. An ultrasound to confirm the proper growth and development of the baby. An amniocentesis to check for possible genetic problems. Fetal screens for spina bifida and Down syndrome. HIV (human immunodeficiency virus) testing. Routine prenatal testing includes screening for HIV, unless you choose not to have this test.  Follow these instructions at home: Medicines Follow your health care provider's instructions regarding medicine use. Specific medicines may be either safe or unsafe to take during  pregnancy. Take a prenatal vitamin that contains at least 600 micrograms (mcg) of folic acid. If you develop constipation, try taking a stool softener if your health care provider approves. Eating and drinking Eat a balanced diet that includes fresh fruits and vegetables, whole grains, good sources of protein such as meat, eggs, or tofu, and low-fat dairy. Your health care provider will help you determine the amount of weight gain that is right for you. Avoid raw meat and uncooked cheese. These carry germs that can cause birth defects in the baby. If you have low calcium intake from food, talk to your health care provider about whether you should take a daily calcium supplement. Limit foods that are high in fat and processed sugars, such as fried and sweet foods. To prevent constipation: Drink enough fluid to keep your urine clear or pale yellow. Eat foods that are high in fiber, such as fresh fruits and vegetables, whole grains, and beans. Activity Exercise only as directed by your health care provider. Most women can continue their usual exercise routine during pregnancy. Try to exercise for 30 minutes at least 5 days a week. Stop exercising if you experience uterine contractions. Avoid heavy lifting, wear low heel shoes, and practice good posture. A sexual relationship may be continued unless your health care provider directs you otherwise. Relieving pain and discomfort Wear a good support bra to prevent discomfort from breast tenderness. Take warm sitz baths to soothe any pain or discomfort caused by hemorrhoids. Use hemorrhoid cream if your health care provider approves. Rest with your legs elevated if you have leg cramps or low back pain. If you develop varicose veins, wear support hose. Elevate your feet for 15 minutes, 3-4 times a day. Limit salt in your diet. Prenatal Care Write down your questions. Take them to your prenatal visits. Keep all your prenatal visits as told by your health  care provider. This is important. Safety Wear your seat belt at all times when driving. Make a list of emergency phone numbers, including numbers for family, friends, the hospital, and police and fire departments. General instructions Ask your health care provider for a referral to a local prenatal education class. Begin classes no later than the beginning of month 6 of your pregnancy. Ask for help if you have counseling or nutritional needs during pregnancy. Your health care provider can offer advice or refer you to specialists for help with various needs. Do not use hot tubs, steam rooms, or saunas. Do not douche or use tampons or scented sanitary pads. Do not cross your legs for long periods of time. Avoid cat litter boxes and soil used by cats. These carry germs that can cause birth defects in the baby and possibly loss of the   fetus by miscarriage or stillbirth. Avoid all smoking, herbs, alcohol, and unprescribed drugs. Chemicals in these products can affect the formation and growth of the baby. Do not use any products that contain nicotine or tobacco, such as cigarettes and e-cigarettes. If you need help quitting, ask your health care provider. Visit your dentist if you have not gone yet during your pregnancy. Use a soft toothbrush to brush your teeth and be gentle when you floss. Contact a health care provider if: You have dizziness. You have mild pelvic cramps, pelvic pressure, or nagging pain in the abdominal area. You have persistent nausea, vomiting, or diarrhea. You have a bad smelling vaginal discharge. You have pain when you urinate. Get help right away if: You have a fever. You are leaking fluid from your vagina. You have spotting or bleeding from your vagina. You have severe abdominal cramping or pain. You have rapid weight gain or weight loss. You have shortness of breath with chest pain. You notice sudden or extreme swelling of your face, hands, ankles, feet, or legs. You  have not felt your baby move in over an hour. You have severe headaches that do not go away when you take medicine. You have vision changes. Summary The second trimester is from week 14 through week 27 (months 4 through 6). It is also a time when the fetus is growing rapidly. Your body goes through many changes during pregnancy. The changes vary from woman to woman. Avoid all smoking, herbs, alcohol, and unprescribed drugs. These chemicals affect the formation and growth your baby. Do not use any tobacco products, such as cigarettes, chewing tobacco, and e-cigarettes. If you need help quitting, ask your health care provider. Contact your health care provider if you have any questions. Keep all prenatal visits as told by your health care provider. This is important. This information is not intended to replace advice given to you by your health care provider. Make sure you discuss any questions you have with your health care provider. Document Released: 07/29/2001 Document Revised: 01/10/2016 Document Reviewed: 10/05/2012 Elsevier Interactive Patient Education  2017 Elsevier Inc.  

## 2022-06-26 LAB — ANTIBODY SCREEN: Antibody Screen: NEGATIVE

## 2022-06-26 LAB — HIV ANTIBODY (ROUTINE TESTING W REFLEX): HIV Screen 4th Generation wRfx: NONREACTIVE

## 2022-06-26 LAB — CBC
Hematocrit: 34.6 % (ref 34.0–46.6)
Hemoglobin: 11.3 g/dL (ref 11.1–15.9)
MCH: 25.7 pg — ABNORMAL LOW (ref 26.6–33.0)
MCHC: 32.7 g/dL (ref 31.5–35.7)
MCV: 79 fL (ref 79–97)
Platelets: 287 10*3/uL (ref 150–450)
RBC: 4.39 x10E6/uL (ref 3.77–5.28)
RDW: 13.1 % (ref 11.7–15.4)
WBC: 7.8 10*3/uL (ref 3.4–10.8)

## 2022-06-26 LAB — RPR: RPR Ser Ql: NONREACTIVE

## 2022-06-26 LAB — GLUCOSE TOLERANCE, 2 HOURS W/ 1HR
Glucose, 1 hour: 158 mg/dL (ref 70–179)
Glucose, 2 hour: 106 mg/dL (ref 70–152)
Glucose, Fasting: 81 mg/dL (ref 70–91)

## 2022-07-16 ENCOUNTER — Ambulatory Visit (INDEPENDENT_AMBULATORY_CARE_PROVIDER_SITE_OTHER): Payer: Medicaid Other

## 2022-07-16 ENCOUNTER — Ambulatory Visit (INDEPENDENT_AMBULATORY_CARE_PROVIDER_SITE_OTHER): Payer: Medicaid Other | Admitting: Advanced Practice Midwife

## 2022-07-16 VITALS — BP 113/79 | HR 69 | Wt 226.0 lb

## 2022-07-16 DIAGNOSIS — Z3A29 29 weeks gestation of pregnancy: Secondary | ICD-10-CM

## 2022-07-16 DIAGNOSIS — O3503X0 Maternal care for (suspected) central nervous system malformation or damage in fetus, choroid plexus cysts, not applicable or unspecified: Secondary | ICD-10-CM | POA: Diagnosis not present

## 2022-07-16 DIAGNOSIS — Z3480 Encounter for supervision of other normal pregnancy, unspecified trimester: Secondary | ICD-10-CM

## 2022-07-16 NOTE — Progress Notes (Signed)
   LOW-RISK PREGNANCY VISIT Patient name: Brittany Morton MRN [id number]  Date of birth: 2001-06-30 Chief Complaint:   Routine Prenatal Visit  History of Present Illness:   Brittany Morton is a 21 y.o. G65P1001 female at [redacted]w[redacted]d with an Estimated Date of Delivery: 09/30/22 being seen today for ongoing management of a low-risk pregnancy.  Today she reports no complaints. Contractions: Not present. Vag. Bleeding: None.  Movement: Present. denies leaking of fluid. Review of Systems:   Pertinent items are noted in HPI Denies abnormal vaginal discharge w/ itching/odor/irritation, headaches, visual changes, shortness of breath, chest pain, abdominal pain, severe nausea/vomiting, or problems with urination or bowel movements unless otherwise stated above. Pertinent History Reviewed:  Reviewed past medical,surgical, social, obstetrical and family history.  Reviewed problem list, medications and allergies. Physical Assessment:   Vitals:   07/16/22 1000  BP: 113/79  Pulse: 69  Weight: 226 lb (102.5 kg)  Body mass index is 41.34 kg/m.        Physical Examination:   General appearance: Well appearing, and in no distress  Mental status: Alert, oriented to person, place, and time  Skin: Warm & dry  Cardiovascular: Normal heart rate noted  Respiratory: Normal respiratory effort, no distress  Abdomen: Soft, gravid, nontender  Pelvic: Cervical exam deferred         Extremities:    Fetal Status: Fetal Heart Rate (bpm): 148 u/s   Movement: Present    F/U scan for bilat CPCs: [place] 29+1 wks,cephalic,cx 3.1 cm,anterior fundal placenta gr 0,normal ovaries,AFI 21 cm,FHR 148 bpm,EFW 1465 g 63%,resolved CPC   No results found for this or any previous visit (from the past 24 hour(s)).  Assessment & Plan:  1) Low-risk pregnancy G2P1001 at [redacted]w[redacted]d with an Estimated Date of Delivery: 09/30/22   2) Might want waterbirth- Pt is interested in waterbirth.  No contraindications at this time per chart  review/patient assessment.   - Pt to enroll in class, see CNMs for most visits in the office.  - Discussed waterbirth as option for low-risk pregnancy.  Reviewed conditions that may arise during pregnancy that will risk pt out of waterbirth including hypertension, diabetes, fetal growth restriction <10%ile, etc.  3) Hx gHTN, taking bASA  4) Bilat CPCs, resolved on u/s today   Meds: No orders of the defined types were placed in this encounter.  Labs/procedures today: declines Tdap and flu  Plan:  Continue routine obstetrical care    Reviewed: Preterm labor symptoms and general obstetric precautions including but not limited to vaginal bleeding, contractions, leaking of fluid and fetal movement were reviewed in detail with the patient.  All questions were answered. Has home bp cuff. Check bp weekly, let us know if >140/90.   Follow-up: Return in about 2 weeks (around 07/30/2022) for LROB, in person.  No orders of the defined types were placed in this encounter.  Arabella Merles CNM 07/16/2022 10:23 AM

## 2022-07-16 NOTE — Progress Notes (Signed)
[place] 29+1 wks,cephalic,cx 3.1 cm,anterior fundal placenta gr 0,normal ovaries,AFI 21 cm,FHR 148 bpm,EFW 1465 g 63%,resolved CPC

## 2022-07-16 NOTE — Patient Instructions (Addendum)
[place], thank you for choosing our office today! We appreciate the opportunity to meet your healthcare needs. You may receive a short survey by mail, e-mail, or through [employer]. If you are happy with your care we would appreciate if you could take just a few minutes to complete the survey questions. We read all of your comments and take your feedback very seriously. Thank you again for choosing our office.  [facility] [employer] Team at [facility]   Considering [place]? Guide for patients at [facility] [employer] [facility]) Why consider waterbirth? Gentle birth for babies  Less pain medicine used in labor  May allow for passive descent/less pushing  May reduce perineal tears  More mobility and instinctive maternal position changes  Increased maternal relaxation   Is waterbirth safe? What are the risks of infection, drowning or other complications? Infection:  Very low risk (3.7 % for tub vs 4.8% for bed)  7 in 8000 waterbirths with documented infection  Poorly cleaned equipment most common cause  Slightly lower group B strep transmission rate  Drowning  Maternal:  Very low risk  Related to seizures or fainting  Newborn:  Very low risk. No evidence of increased risk of respiratory problems in multiple large studies  Physiological protection from breathing under water  Avoid underwater birth if there are any fetal complications  Once baby's head is out of the water, keep it out.  Birth complication  Some reports of cord trauma, but risk decreased by bringing baby to surface gradually  No evidence of increased risk of shoulder dystocia. Mothers can usually change positions faster in water than in a bed, possibly aiding the maneuvers to free the shoulder.   There are 2 things you MUST do to have a waterbirth with [facility]: Attend a waterbirth class at [employer] [facility]   3rd Wednesday of every month from 7-9 pm (virtual during COVID) [employer] at [email or web address] or [email or web address] or by calling [phone] Bring [place] the certificate from the class to your prenatal appointment or send via MyChart Meet with a midwife at 36 weeks* to see if you can still plan a waterbirth and to sign the consent.   *We also recommend that you schedule as many of your prenatal visits with a midwife as possible.    Helpful information: You may want to bring a bathing suit top to the hospital to wear during labor but this is optional.  All other supplies are provided by the hospital. Please arrive at the hospital with signs of active labor, and do not wait at home until late in labor. It takes 45 min- 1 hour for fetal monitoring, and check in to your room to take place, plus transport and filling of the waterbirth tub.    Things that would prevent you from having a waterbirth: Premature, <37wks  Previous cesarean birth  Presence of thick meconium-stained fluid  Multiple gestation (Twins, triplets, etc.)  Uncontrolled diabetes or gestational diabetes requiring medication  Hypertension diagnosed in pregnancy or preexisting hypertension (gestational hypertension, preeclampsia, or chronic hypertension) Fetal growth restriction (your baby measures less than 10th percentile on ultrasound) Heavy vaginal bleeding  Non-reassuring fetal heart rate  Active infection (MRSA, etc.). Group B Strep is NOT a contraindication for waterbirth.  If your labor has to be induced and induction method requires continuous monitoring of the baby's heart rate  Other risks/issues identified by your obstetrical provider   Please remember that birth is unpredictable. Under certain  unforeseeable circumstances your provider may advise against giving birth in the tub. These decisions will be made on a case-by-case basis and with the safety of you and your baby as our highest priority.    Updated 11/20/21      [facility] ([address] [place], [place] [address]) Entrance C, located off of [address] Free 24/7 valet parking   CLASSES: Go to [email or web address] to register for classes (childbirth, breastfeeding, waterbirth, infant CPR, daddy bootcamp, etc.)  Call the office [phone]) or go to [facility] if: You begin to have strong, frequent contractions Your water breaks.  Sometimes it is a big gush of fluid, sometimes it is just a trickle that keeps getting your panties wet or running down your legs You have vaginal bleeding.  It is normal to have a small amount of spotting if your cervix was checked.  You don't feel your baby moving like normal.  If you don't, get you something to eat and drink and lay down and focus on feeling your baby move.   If your baby is still not moving like normal, you should call the office or go to [facility].  Call the office [phone]) or go to [facility] for these signs of pre-eclampsia: Severe headache that does not go away with Tylenol Visual changes- seeing spots, double, blurred vision Pain under your right breast or upper abdomen that does not go away with Tums or heartburn medicine Nausea and/or vomiting Severe swelling in your hands, feet, and face   Tdap Vaccine It is recommended that you get the Tdap vaccine during the third trimester of EACH pregnancy to help protect your baby from getting pertussis (whooping cough) 27-36 weeks is the BEST time to do this so that you can pass the protection on to your baby. During pregnancy is better than after pregnancy, but if you are unable to get it during pregnancy it will be offered at the hospital.  You can get this vaccine with us, at the health department, your family doctor, or some local pharmacies Everyone who will be around your baby should also be up-to-date on their vaccines before the baby comes. Adults (who are not pregnant) only need 1 dose of Tdap during adulthood.   [facility]  Pediatricians/Family Doctors [facility]): [address], [phone]           [facility]: [address], [phone]                [facility]): [address], [phone] (call to ask if accepting patients) [facility]: [address], [place], [phone]    [facility] Pediatricians/Family Doctors [facility]): [phone] [address], [phone] [facility]: [address], [phone] [facility]: [address], [phone]  [facility]  [facility]): [phone] [facility]: [address], [phone]   [facility] Family Doctors [facility]: [address], [phone]  [facility]  [facility]: [phone] [address], [phone]  Home Blood Pressure Monitoring for Patients   Your provider has recommended that you check your blood pressure (BP) at least once a week at home. If you do not have a blood pressure cuff at home, one will be provided for you. Contact your provider if you have not received your  monitor within 1 week.   Helpful Tips for Accurate Home Blood Pressure Checks  Don't smoke, exercise, or drink caffeine 30 minutes before checking your BP Use the restroom before checking your BP (a full bladder can raise your pressure) Relax in a comfortable upright chair Feet on the ground Left arm resting comfortably on a flat surface at the level of your heart Legs uncrossed Back supported Sit quietly and don't talk Place the cuff on your bare arm Adjust snuggly, so that only two fingertips can fit between your skin and the top of the cuff Check 2 readings separated by at least one minute Keep a log of your BP readings For a visual, please reference this diagram:  http://ccnc.care/bpdiagram  Provider Name: [facility]     Phone: [phone]  Zone 1: ALL CLEAR  Continue to monitor your symptoms:  BP reading is less than 140 (top number) or less than 90 (bottom number)  No right upper stomach pain No headaches or seeing spots No feeling nauseated or throwing up No swelling in face and hands  Zone 2: CAUTION Call your doctor's office for any of the following:  BP reading is greater than 140 (top number) or greater than 90 (bottom number)  Stomach pain under your ribs in the middle or right side Headaches or seeing spots Feeling nauseated or throwing up Swelling in face and hands  Zone 3: EMERGENCY  Seek immediate medical care if you have any of the following:  BP reading is greater than160 (top number) or greater than 110 (bottom number) Severe headaches not improving with Tylenol Serious difficulty catching your breath Any worsening symptoms from Zone 2   Third Trimester of Pregnancy The third trimester is from week 29 through week 42, months 7 through 9. The third trimester is a time when the fetus is growing rapidly. At the end of the ninth month, the fetus is about 20 inches in length and weighs 6-10 pounds.  BODY CHANGES Your body goes through many changes during pregnancy. The changes vary from woman to woman.  Your weight will continue to increase. You can expect to gain 25-35 pounds (11-16 kg) by the end of the pregnancy. You may begin to get stretch marks on your hips, abdomen, and breasts. You may urinate more often because the fetus is moving lower into your pelvis and pressing on your bladder. You may develop or continue to have heartburn as a result of your pregnancy. You may develop constipation because certain hormones are causing the muscles that push waste through your intestines to slow down. You may develop hemorrhoids or swollen, bulging veins (varicose veins). You may have pelvic pain because of the weight gain  and pregnancy hormones relaxing your joints between the bones in your pelvis. Backaches may result from overexertion of the muscles supporting your posture. You may have changes in your hair. These can include thickening of your hair, rapid growth, and changes in texture. Some women also have hair loss during or after pregnancy, or hair that feels dry or thin. Your hair will most likely return to normal after your baby is born. Your breasts will continue to grow and be tender. A yellow discharge may leak from your breasts called colostrum. Your belly button may stick out. You may feel short of breath because of your expanding uterus. You may notice the fetus "dropping," or moving lower in your abdomen. You may have a bloody mucus discharge. This usually occurs a few days to a  week before labor begins. Your cervix becomes thin and soft (effaced) near your due date. WHAT TO EXPECT AT YOUR PRENATAL EXAMS  You will have prenatal exams every 2 weeks until week 36. Then, you will have weekly prenatal exams. During a routine prenatal visit: You will be weighed to make sure you and the fetus are growing normally. Your blood pressure is taken. Your abdomen will be measured to track your baby's growth. The fetal heartbeat will be listened to. Any test results from the previous visit will be discussed. You may have a cervical check near your due date to see if you have effaced. At around 36 weeks, your caregiver will check your cervix. At the same time, your caregiver will also perform a test on the secretions of the vaginal tissue. This test is to determine if a type of bacteria, Group B streptococcus, is present. Your caregiver will explain this further. Your caregiver may ask you: What your birth plan is. How you are feeling. If you are feeling the baby move. If you have had any abnormal symptoms, such as leaking fluid, bleeding, severe headaches, or abdominal cramping. If you have any  questions. Other tests or screenings that may be performed during your third trimester include: Blood tests that check for low iron levels (anemia). Fetal testing to check the health, activity level, and growth of the fetus. Testing is done if you have certain medical conditions or if there are problems during the pregnancy. FALSE LABOR You may feel small, irregular contractions that eventually go away. These are called Braxton Hicks contractions, or false labor. Contractions may last for hours, days, or even weeks before true labor sets in. If contractions come at regular intervals, intensify, or become painful, it is best to be seen by your caregiver.  SIGNS OF LABOR  Menstrual-like cramps. Contractions that are 5 minutes apart or less. Contractions that start on the top of the uterus and spread down to the lower abdomen and back. A sense of increased pelvic pressure or back pain. A watery or bloody mucus discharge that comes from the vagina. If you have any of these signs before the 37th week of pregnancy, call your caregiver right away. You need to go to the hospital to get checked immediately. HOME CARE INSTRUCTIONS  Avoid all smoking, herbs, alcohol, and unprescribed drugs. These chemicals affect the formation and growth of the baby. Follow your caregiver's instructions regarding medicine use. There are medicines that are either safe or unsafe to take during pregnancy. Exercise only as directed by your caregiver. Experiencing uterine cramps is a good sign to stop exercising. Continue to eat regular, healthy meals. Wear a good support bra for breast tenderness. Do not use hot tubs, steam rooms, or saunas. Wear your seat belt at all times when driving. Avoid raw meat, uncooked cheese, cat litter boxes, and soil used by cats. These carry germs that can cause birth defects in the baby. Take your prenatal vitamins. Try taking a stool softener (if your caregiver approves) if you develop  constipation. Eat more high-fiber foods, such as fresh vegetables or fruit and whole grains. Drink plenty of fluids to keep your urine clear or pale yellow. Take warm sitz baths to soothe any pain or discomfort caused by hemorrhoids. Use hemorrhoid cream if your caregiver approves. If you develop varicose veins, wear support hose. Elevate your feet for 15 minutes, 3-4 times a day. Limit salt in your diet. Avoid heavy lifting, wear low heal shoes, and practice good  posture. Rest a lot with your legs elevated if you have leg cramps or low back pain. Visit your dentist if you have not gone during your pregnancy. Use a soft toothbrush to brush your teeth and be gentle when you floss. A sexual relationship may be continued unless your caregiver directs you otherwise. Do not travel far distances unless it is absolutely necessary and only with the approval of your caregiver. Take prenatal classes to understand, practice, and ask questions about the labor and delivery. Make a trial run to the hospital. Pack your hospital bag. Prepare the baby's nursery. Continue to go to all your prenatal visits as directed by your caregiver. SEEK MEDICAL CARE IF: You are unsure if you are in labor or if your water has broken. You have dizziness. You have mild pelvic cramps, pelvic pressure, or nagging pain in your abdominal area. You have persistent nausea, vomiting, or diarrhea. You have a bad smelling vaginal discharge. You have pain with urination. SEEK IMMEDIATE MEDICAL CARE IF:  You have a fever. You are leaking fluid from your vagina. You have spotting or bleeding from your vagina. You have severe abdominal cramping or pain. You have rapid weight loss or gain. You have shortness of breath with chest pain. You notice sudden or extreme swelling of your face, hands, ankles, feet, or legs. You have not felt your baby move in over an hour. You have severe headaches that do not go away with medicine. You have  vision changes. Document Released: 07/29/2001 Document Revised: 08/09/2013 Document Reviewed: 10/05/2012 [facility] Patient Information 2015 [place], [place]. This information is not intended to replace advice given to you by your health care provider. Make sure you discuss any questions you have with your health care provider.

## 2022-07-29 ENCOUNTER — Encounter: Payer: Self-pay | Admitting: Advanced Practice Midwife

## 2022-07-30 ENCOUNTER — Encounter: Payer: Self-pay | Admitting: *Deleted

## 2022-07-31 ENCOUNTER — Ambulatory Visit (INDEPENDENT_AMBULATORY_CARE_PROVIDER_SITE_OTHER): Payer: Medicaid Other | Admitting: Obstetrics & Gynecology

## 2022-07-31 ENCOUNTER — Encounter: Payer: Self-pay | Admitting: Obstetrics & Gynecology

## 2022-07-31 VITALS — BP 119/70 | HR 104 | Wt 226.0 lb

## 2022-07-31 DIAGNOSIS — O26843 Uterine size-date discrepancy, third trimester: Secondary | ICD-10-CM

## 2022-07-31 DIAGNOSIS — Z3483 Encounter for supervision of other normal pregnancy, third trimester: Secondary | ICD-10-CM

## 2022-07-31 DIAGNOSIS — Z3A31 31 weeks gestation of pregnancy: Secondary | ICD-10-CM

## 2022-07-31 NOTE — Progress Notes (Signed)
   LOW-RISK PREGNANCY VISIT Patient name: Brittany Morton MRN [id number]  Date of birth: 11/07/2000 Chief Complaint:   Routine Prenatal Visit  History of Present Illness:   Brittany Morton is a 21 y.o. G33P1001 female at [redacted]w[redacted]d with an Estimated Date of  Delivery: 09/30/22 being seen today for ongoing management of a low-risk pregnancy.      03/19/2022    9:34 AM 11/05/2020   10:04 AM 10/08/2020   11:09 AM 10/27/2016   10:06 AM 09/30/2016   11:43 AM  Depression screen PHQ 2/9  Decreased Interest 2 0 2 0 0  Down, Depressed, Hopeless 0 0 1 0 0  PHQ - 2 Score 2 0 3 0 0  Altered sleeping 2  2 0 1  Tired, decreased energy 2  3 2 1   Change in appetite 0  1 0 1  Feeling bad or failure about yourself  0  1 0 0  Trouble concentrating 0  1 0 0  Moving slowly or fidgety/restless 0  0 0 0  Suicidal thoughts 0  0 0 0  PHQ-9 Score 6  11 2 3     Today she reports no complaints. Contractions: Not present. Vag. Bleeding: None.  Movement: Present. denies leaking of fluid. Review of Systems:   Pertinent items are noted in HPI Denies abnormal vaginal discharge w/ itching/odor/irritation, headaches, visual changes, shortness of breath, chest pain, abdominal pain, severe nausea/vomiting, or problems with urination or bowel movements unless otherwise stated above. Pertinent History Reviewed:  Reviewed past medical,surgical, social, obstetrical and family history.  Reviewed problem list, medications and allergies.  Physical Assessment:   Vitals:   07/31/22 1613  BP: 119/70  Pulse: (!) 104  Weight: 226 lb (102.5 kg)  Body mass index is 41.34 kg/m.        Physical Examination:   General appearance: Well appearing, and in no distress  Mental status: Alert, oriented to person, place, and time  Skin: Warm & dry  Respiratory: Normal respiratory effort, no distress  Abdomen: Soft, gravid, nontender  Pelvic: Cervical exam deferred         Extremities: Edema: None  Psych:  mood and affect  appropriate  Fetal Status: Fetal Heart Rate (bpm): 150 Fundal Height: 33 cm Movement: Present    Chaperone: n/a    No results found for this or any previous visit (from the past 24 hour(s)).   Assessment & Plan:  1) Low-risk pregnancy G2P1001 at [redacted]w[redacted]d with an Estimated Date of Delivery: 09/30/22   -desires waterbirth -Measuring large for dates, []  growth scan next available   Meds: No orders of the defined types were placed in this encounter.  Labs/procedures today: doppler  Plan:  Continue routine obstetrical care  Next visit: prefers in person    Reviewed: Preterm labor symptoms and general obstetric precautions including but not limited to vaginal bleeding, contractions, leaking of fluid and fetal movement were reviewed in detail with the patient.  All questions were answered. Pt has home bp cuff. Check bp weekly, let [redacted]w[redacted]d know if >140/90.   Follow-up: Return in about 2 weeks (around 08/14/2022) for LROB visit and growth next available.  No orders of the defined types were placed in this encounter.   , DO Attending Obstetrician & Gynecologist, [facility] for 08/16/2022, [facility]

## 2022-08-14 ENCOUNTER — Encounter: Payer: Medicaid Other | Admitting: Obstetrics & Gynecology

## 2022-08-27 ENCOUNTER — Other Ambulatory Visit: Payer: Self-pay | Admitting: Obstetrics & Gynecology

## 2022-08-27 DIAGNOSIS — O3663X0 Maternal care for excessive fetal growth, third trimester, not applicable or unspecified: Secondary | ICD-10-CM

## 2022-08-27 DIAGNOSIS — O3660X0 Maternal care for excessive fetal growth, unspecified trimester, not applicable or unspecified: Secondary | ICD-10-CM

## 2022-08-28 ENCOUNTER — Ambulatory Visit (INDEPENDENT_AMBULATORY_CARE_PROVIDER_SITE_OTHER): Payer: Medicaid Other | Admitting: Obstetrics & Gynecology

## 2022-08-28 ENCOUNTER — Ambulatory Visit (INDEPENDENT_AMBULATORY_CARE_PROVIDER_SITE_OTHER): Payer: Medicaid Other

## 2022-08-28 VITALS — BP 119/75 | HR 78 | Wt 232.0 lb

## 2022-08-28 DIAGNOSIS — Z3A35 35 weeks gestation of pregnancy: Secondary | ICD-10-CM

## 2022-08-28 DIAGNOSIS — Z3483 Encounter for supervision of other normal pregnancy, third trimester: Secondary | ICD-10-CM

## 2022-08-28 DIAGNOSIS — Z3480 Encounter for supervision of other normal pregnancy, unspecified trimester: Secondary | ICD-10-CM

## 2022-08-28 DIAGNOSIS — Z8759 Personal history of other complications of pregnancy, childbirth and the puerperium: Secondary | ICD-10-CM

## 2022-08-28 DIAGNOSIS — O3663X0 Maternal care for excessive fetal growth, third trimester, not applicable or unspecified: Secondary | ICD-10-CM | POA: Diagnosis not present

## 2022-08-28 NOTE — Progress Notes (Signed)
[place] 08+0 wks,cephalic,cx 2.9 cm,fundal placenta gr 3,FHR 148 bpm,AFI 21 cm,EFW 2765 g 62%

## 2022-08-28 NOTE — Progress Notes (Signed)
   LOW-RISK PREGNANCY VISIT Patient name: Brittany Morton MRN [id number]  Date of birth: 01-27-2001 Chief Complaint:   Routine Prenatal Visit  History of Present Illness:   Brittany Morton is a 22 y.o. G60P1001 female at [redacted]w[redacted]d with an Estimated Date of Delivery: 09/30/22 being seen today for ongoing management of a low-risk pregnancy.      03/19/2022    9:34 AM 11/05/2020   10:04 AM 10/08/2020   11:09 AM 10/27/2016   10:06 AM 09/30/2016   11:43 AM  Depression screen PHQ 2/9  Decreased Interest 2 0 2 0 0  Down, Depressed, Hopeless 0 0 1 0 0  PHQ - 2 Score 2 0 3 0 0  Altered sleeping 2  2 0 1  Tired, decreased energy 2  3 2 1   Change in appetite 0  1 0 1  Feeling bad or failure about yourself  0  1 0 0  Trouble concentrating 0  1 0 0  Moving slowly or fidgety/restless 0  0 0 0  Suicidal thoughts 0  0 0 0  PHQ-9 Score 6  11 2 3     Today she reports no complaints. Contractions: Not present. Vag. Bleeding: None.  Movement: Present. denies leaking of fluid. Review of Systems:   Pertinent items are noted in HPI Denies abnormal vaginal discharge w/ itching/odor/irritation, headaches, visual changes, shortness of breath, chest pain, abdominal pain, severe nausea/vomiting, or problems with urination or bowel movements unless otherwise stated above. Pertinent History Reviewed:  Reviewed past medical,surgical, social, obstetrical and family history.  Reviewed problem list, medications and allergies.  Physical Assessment:   Vitals:   08/28/22 1107  BP: 119/75  Pulse: 78  Weight: 232 lb (105.2 kg)  Body mass index is 42.43 kg/m.        Physical Examination:   General appearance: Well appearing, and in no distress  Mental status: Alert, oriented to person, place, and time  Skin: Warm & dry  Respiratory: Normal respiratory effort, no distress  Abdomen: Soft, gravid, nontender  Pelvic: Cervical exam deferred         Extremities:  no edema  Psych:  mood and affect  appropriate  Fetal Status:     Movement: Present   cephalic,cx 2.9 cm,fundal placenta gr 3,FHR 148 bpm,AFI 21 cm,EFW 2765 g 62%   Chaperone: n/a    No results found for this or any previous visit (from the past 24 hour(s)).   Assessment & Plan:  1) Low-risk pregnancy G2P1001 at [redacted]w[redacted]d with an Estimated Date of Delivery: 09/30/22   -reviewed today's anatomy scan- AGA  2) Desires waterbirth -reviewed how to sign up for class, which must be completed prior to labor []  next class Feb. 1 pt aware   Meds: No orders of the defined types were placed in this encounter.  Labs/procedures today: growth scan  Plan:  Continue routine obstetrical care  Next visit: prefers in person    Reviewed: Preterm labor symptoms and general obstetric precautions including but not limited to vaginal bleeding, contractions, leaking of fluid and fetal movement were reviewed in detail with the patient.  All questions were answered. Pt has home bp cuff. Check bp weekly, let us know if >140/90.   Follow-up: Return in about 1 week (around 09/04/2022) for [place] visit.  No orders of the defined types were placed in this encounter.   Janyth Pupa, DO Attending [place], [facility] for [employer], [place]

## 2022-09-04 ENCOUNTER — Encounter: Payer: Medicaid Other | Admitting: Advanced Practice Midwife

## 2022-09-11 ENCOUNTER — Ambulatory Visit (INDEPENDENT_AMBULATORY_CARE_PROVIDER_SITE_OTHER): Payer: Medicaid Other | Admitting: Advanced Practice Midwife

## 2022-09-11 ENCOUNTER — Other Ambulatory Visit (HOSPITAL_COMMUNITY)
Admission: RE | Admit: 2022-09-11 | Discharge: 2022-09-11 | Disposition: A | Discharge status: Home / Self Care | Payer: Medicaid Other | Source: Ambulatory Visit | Attending: Advanced Practice Midwife | Admitting: Advanced Practice Midwife

## 2022-09-11 ENCOUNTER — Encounter: Payer: Medicaid Other | Admitting: Advanced Practice Midwife

## 2022-09-11 VITALS — BP 122/85 | HR 94 | Wt 231.0 lb

## 2022-09-11 DIAGNOSIS — Z3A37 37 weeks gestation of pregnancy: Secondary | ICD-10-CM | POA: Diagnosis not present

## 2022-09-11 DIAGNOSIS — Z3480 Encounter for supervision of other normal pregnancy, unspecified trimester: Secondary | ICD-10-CM

## 2022-09-11 DIAGNOSIS — Z3483 Encounter for supervision of other normal pregnancy, third trimester: Secondary | ICD-10-CM

## 2022-09-11 NOTE — Progress Notes (Signed)
   LOW-RISK PREGNANCY VISIT Patient name: Brittany Morton MRN [id number]  Date of birth: 2001/08/01 Chief Complaint:   Routine Prenatal Visit  History of Present Illness:   Brittany Morton is a 22 y.o. G15P1001 female at [redacted]w[redacted]d with an Estimated Date of Delivery: 09/30/22 being seen today for ongoing management of a low-risk pregnancy.  Today she reports no complaints. Contractions: Not present. Vag. Bleeding: None.  Movement: Present. denies leaking of fluid. Review of Systems:   Pertinent items are noted in HPI Denies abnormal vaginal discharge w/ itching/odor/irritation, headaches, visual changes, shortness of breath, chest pain, abdominal pain, severe nausea/vomiting, or problems with urination or bowel movements unless otherwise stated above. Pertinent History Reviewed:  Reviewed past medical,surgical, social, obstetrical and family history.  Reviewed problem list, medications and allergies. Physical Assessment:   Vitals:   09/11/22 1601  BP: 122/85  Pulse: 94  Weight: 231 lb (104.8 kg)  Body mass index is 42.25 kg/m.        Physical Examination:   General appearance: Well appearing, and in no distress  Mental status: Alert, oriented to person, place, and time  Skin: Warm & dry  Cardiovascular: Normal heart rate noted  Respiratory: Normal respiratory effort, no distress  Abdomen: Soft, gravid, nontender  Pelvic: Cervical exam performed  Dilation: 2 Effacement (%): 50 Station: -2  Extremities:    Fetal Status: Fetal Heart Rate (bpm): 132 Fundal Height: 38 cm Movement: Present Presentation: Vertex  Chaperone:  Celene Squibb    No results found for this or any previous visit (from the past 24 hour(s)).  Assessment & Plan:  1) Low-risk pregnancy G2P1001 at [redacted]w[redacted]d with an Estimated Date of Delivery: 09/30/22   2) wants waterbirth, class 2/1   Meds: No orders of the defined types were placed in this encounter.  Labs/procedures today: GBS for sensitivities, GC,  CHL  Plan:  Continue routine obstetrical care  Next visit: prefers in person    Reviewed: Preterm labor symptoms and general obstetric precautions including but not limited to vaginal bleeding, contractions, leaking of fluid and fetal movement were reviewed in detail with the patient.  All questions were answered. Has home bp cuff.. Check bp weekly, let us know if >140/90.   Follow-up: No follow-ups on file.  Future Appointments  Date Time Provider [place]  09/18/2022  2:10 PM Christin Fudge, [place] [facility]  09/25/2022  2:10 PM Chancy Milroy, MD [facility]    Orders Placed This Encounter  Procedures   Strep Gp B NAA+Rflx   Christin Fudge DNP, CNM 09/11/2022 4:35 PM

## 2022-09-13 LAB — STREP GP B NAA+RFLX: Strep Gp B NAA+Rflx: NEGATIVE

## 2022-09-15 LAB — CERVICOVAGINAL ANCILLARY ONLY
Chlamydia: NEGATIVE
Comment: NEGATIVE
Comment: NORMAL
Neisseria Gonorrhea: NEGATIVE

## 2022-09-18 ENCOUNTER — Encounter: Payer: Medicaid Other | Admitting: Advanced Practice Midwife

## 2022-09-18 ENCOUNTER — Encounter: Payer: Self-pay | Admitting: Advanced Practice Midwife

## 2022-09-22 DIAGNOSIS — Z3A38 38 weeks gestation of pregnancy: Secondary | ICD-10-CM | POA: Diagnosis not present

## 2022-09-22 DIAGNOSIS — E669 Obesity, unspecified: Secondary | ICD-10-CM | POA: Diagnosis not present

## 2022-09-22 DIAGNOSIS — O99214 Obesity complicating childbirth: Secondary | ICD-10-CM | POA: Diagnosis not present

## 2022-09-23 ENCOUNTER — Encounter: Payer: Self-pay | Admitting: Advanced Practice Midwife

## 2022-09-23 DIAGNOSIS — D649 Anemia, unspecified: Secondary | ICD-10-CM | POA: Diagnosis not present

## 2022-09-23 DIAGNOSIS — O9903 Anemia complicating the puerperium: Secondary | ICD-10-CM | POA: Diagnosis not present

## 2022-09-23 DIAGNOSIS — D72829 Elevated white blood cell count, unspecified: Secondary | ICD-10-CM | POA: Diagnosis not present

## 2022-09-25 ENCOUNTER — Encounter: Payer: Medicaid Other | Admitting: Obstetrics and Gynecology

## 2022-10-28 ENCOUNTER — Ambulatory Visit (INDEPENDENT_AMBULATORY_CARE_PROVIDER_SITE_OTHER): Payer: Medicaid Other | Admitting: Women's Health

## 2022-10-28 ENCOUNTER — Encounter: Payer: Self-pay | Admitting: Women's Health

## 2022-10-28 DIAGNOSIS — Z3009 Encounter for other general counseling and advice on contraception: Secondary | ICD-10-CM

## 2022-10-28 NOTE — Progress Notes (Signed)
POSTPARTUM VISIT Patient name: Brittany Morton MRN [id number]  Date of birth: 10/23/2000 Chief Complaint:   Postpartum Care  History of Present Illness:   Brittany Morton is a 22 y.o. G57P2002 African American female being seen today for a postpartum visit. She is 5 weeks postpartum following a spontaneous vaginal delivery at 38.6 gestational weeks at [facility] in [place]- was at work when went into labor, had him 1hr after getting to hospital. IOL: no, for n/a. Laceration: none.  Complications: none. Inpatient contraception: no.   Pregnancy uncomplicated. Tobacco use: no. Substance use disorder: no. Last pap smear: 03/19/22 and results were NILM w/ HRHPV not done. Next pap smear due: 2026 No LMP recorded.  Postpartum course has been uncomplicated. Bleeding none. Bowel function is normal. Bladder function is normal. Urinary incontinence? no, fecal incontinence? no Desired contraception: Condoms. Patient does not want a pregnancy in the future.  Desired family size is 2 children.   Upstream - 10/28/22 0939       Pregnancy Intention Screening   Does the patient want to become pregnant in the next year? No    Does the patient's partner want to become pregnant in the next year? No    Would the patient like to discuss contraceptive options today? No      Contraception Wrap Up   Current Method Abstinence    End Method Female Condom    Contraception Counseling Provided --            The pregnancy intention screening data noted above was reviewed. Potential methods of contraception were discussed. The patient elected to proceed with Female Condom.  Edinburgh Postpartum Depression Screening: negative  Edinburgh Postnatal Depression Scale - 10/28/22 0934       Edinburgh Postnatal Depression Scale:  In the Past 7 Days   I have been able to laugh and see the funny side of things. 0    I have looked forward with enjoyment to things. 0    I have blamed myself unnecessarily when things went  wrong. 1    I have been anxious or worried for no good reason. 0    I have felt scared or panicky for no good reason. 1    Things have been getting on top of me. 0    I have been so unhappy that I have had difficulty sleeping. 1    I have felt sad or miserable. 1    I have been so unhappy that I have been crying. 1    The thought of harming myself has occurred to me. 0    Edinburgh Postnatal Depression Scale Total 5                03/19/2022    9:34 AM 10/08/2020   11:10 AM  GAD 7 : Generalized Anxiety Score  Nervous, Anxious, on Edge 0 1  Control/stop worrying 2 2  Worry too much - different things 2 2  Trouble relaxing 0 1  Restless 0 1  Easily annoyed or irritable 0 3  Afraid - awful might happen 0 1  Total GAD 7 Score 4 11     Baby's course has been uncomplicated. Baby is feeding by bottle. Infant has a pediatrician/family doctor? Yes.  Childcare strategy if returning to work/school: n/a-working from home.  Pt has material needs met for her and baby: Yes.   Review of Systems:   Pertinent items are noted in HPI Denies Abnormal vaginal discharge w/ itching/odor/irritation, headaches,  visual changes, shortness of breath, chest pain, abdominal pain, severe nausea/vomiting, or problems with urination or bowel movements. Pertinent History Reviewed:  Reviewed past medical,surgical, obstetrical and family history.  Reviewed problem list, medications and allergies. OB History  Gravida Para Term Preterm AB Living  '2 2 2 '$ 0 0 2  SAB IAB Ectopic Multiple Live Births  0 0 0 0 2    # Outcome Date GA Lbr Len/2nd Weight Sex Delivery Anes PTL Lv  2 Term 09/22/22 [redacted]w[redacted]d 6 lb 15 oz (3.147 kg) M Vag-Spont  N LIV  1 Term 05/05/17 319w2d5:27 / 00:29 8 lb 0.2 oz (3.634 kg) M Vag-Spont None  LIV     Complications: Gestational hypertension   Physical Assessment:   Vitals:   10/28/22 0936  BP: 113/73  Pulse: 71  Weight: 219 lb 12.8 oz (99.7 kg)  Body mass index is 40.2 kg/m.        Physical Examination:   General appearance: alert, well appearing, and in no distress  Mental status: alert, oriented to person, place, and time  Skin: warm & dry   Cardiovascular: normal heart rate noted   Respiratory: normal respiratory effort, no distress   Breasts: deferred, no complaints   Abdomen: soft, non-tender   Pelvic: examination not indicated. Thin prep pap obtained: No  Rectal: not examined  Extremities: Edema: none   Chaperone: N/A         No results found for this or any previous visit (from the past 24 hour(s)).  Assessment & Plan:  1) Postpartum exam 2) 5 wks s/p spontaneous vaginal delivery 3) bottle feeding 4) Depression screening 5) Contraception counseling> plans condoms  Essential components of care per ACOG recommendations:  1.  Mood and well being:  If positive depression screen, discussed and plan developed.  If using tobacco we discussed reduction/cessation and risk of relapse If current substance abuse, we discussed and referral to local resources was offered.   2. Infant care and feeding:  If breastfeeding, discussed returning to work, pumping, breastfeeding-associated pain, guidance regarding return to fertility while lactating if not using another method. If needed, patient was provided with a letter to be allowed to pump q 2-3hrs to support lactation in a private location with access to a refrigerator to store breastmilk.   Recommended that all caregivers be immunized for flu, pertussis and other preventable communicable diseases If pt does not have material needs met for her/baby, referred to local resources for help obtaining these.  3. Sexuality, contraception and birth spacing Provided guidance regarding sexuality, management of dyspareunia, and resumption of intercourse Discussed avoiding interpregnancy interval <32m41m and recommended birth spacing of 18 months  4. Sleep and fatigue Discussed coping options for fatigue and sleep  disruption Encouraged family/partner/community support of 4 hrs of uninterrupted sleep to help with mood and fatigue  5. Physical recovery  If pt had a C/S, assessed incisional pain and providing guidance on normal vs prolonged recovery If pt had a laceration, perineal healing and pain reviewed.  If urinary or fecal incontinence, discussed management and referred to PT or uro/gyn if indicated  Patient is safe to resume physical activity. Discussed attainment of healthy weight.  6.  Chronic disease management Discussed pregnancy complications if any, and their implications for future childbearing and long-term maternal health. Review recommendations for prevention of recurrent pregnancy complications, such as 17 hydroxyprogesterone caproate to reduce risk for recurrent PTB not applicable, or aspirin to reduce risk of preeclampsia not applicable. Pt had  GDM: no. If yes, 2hr GTT scheduled: not applicable. Reviewed medications and non-pregnant dosing including consideration of whether pt is breastfeeding using a reliable resource such as LactMed: not applicable Referred for f/u w/ PCP or subspecialist providers as indicated: not applicable  7. Health maintenance Mammogram at 23yo or earlier if indicated Pap smears as indicated  Meds: No orders of the defined types were placed in this encounter.   Follow-up: Return in about 1 year (around 10/28/2023) for Physical.   No orders of the defined types were placed in this encounter.   [place], WHNP-BC 10/28/2022 10:00 AM

## 2022-11-10 ENCOUNTER — Encounter: Payer: Self-pay | Admitting: Women's Health

## 2022-11-10 ENCOUNTER — Encounter: Payer: Self-pay | Admitting: *Deleted

## 2023-12-25 ENCOUNTER — Other Ambulatory Visit (HOSPITAL_COMMUNITY)
Admission: RE | Admit: 2023-12-25 | Discharge: 2023-12-25 | Disposition: A | Discharge status: Home / Self Care | Source: Ambulatory Visit | Attending: Obstetrics & Gynecology | Admitting: Obstetrics & Gynecology

## 2023-12-25 ENCOUNTER — Ambulatory Visit: Admitting: *Deleted

## 2023-12-25 DIAGNOSIS — Z113 Encounter for screening for infections with a predominantly sexual mode of transmission: Secondary | ICD-10-CM

## 2023-12-25 NOTE — Progress Notes (Signed)
   NURSE VISIT- VAGINITIS/STD/POC  SUBJECTIVE:  Brittany Morton is a 23 y.o. [id number] GYN patientfemale here for a vaginal swab for STD screen.  She reports the following symptoms: none for 0 days. Denies abnormal vaginal bleeding, significant pelvic pain, fever, or UTI symptoms.  OBJECTIVE:  There were no vitals taken for this visit.  Appears well, in no apparent distress  ASSESSMENT: Vaginal swab for STD screen  PLAN: Self-collected vaginal probe for Gonorrhea, Chlamydia, Trichomonas, Bacterial Vaginosis, Yeast sent to lab Treatment: to be determined once results are received Follow-up as needed if symptoms persist/worsen, or new symptoms develop  Kerrie Peek  12/25/2023 10:20 AM

## 2023-12-28 ENCOUNTER — Telehealth: Payer: Self-pay | Admitting: Adult Health

## 2023-12-28 ENCOUNTER — Other Ambulatory Visit: Payer: Self-pay | Admitting: Adult Health

## 2023-12-28 DIAGNOSIS — A749 Chlamydial infection, unspecified: Secondary | ICD-10-CM | POA: Insufficient documentation

## 2023-12-28 LAB — CERVICOVAGINAL ANCILLARY ONLY
Bacterial Vaginitis (gardnerella): POSITIVE — AB
Candida Glabrata: NEGATIVE
Candida Vaginitis: NEGATIVE
Chlamydia: POSITIVE — AB
Comment: NEGATIVE
Comment: NEGATIVE
Comment: NEGATIVE
Comment: NEGATIVE
Comment: NEGATIVE
Comment: NORMAL
Neisseria Gonorrhea: NEGATIVE
Trichomonas: NEGATIVE

## 2023-12-28 MED ORDER — DOXYCYCLINE HYCLATE 100 MG PO TABS: 100.0000 mg | ORAL_TABLET | Freq: Two times a day (BID) | ORAL | 0 refills | Status: AC

## 2023-12-28 MED ORDER — METRONIDAZOLE 500 MG PO TABS: 500.0000 mg | ORAL_TABLET | Freq: Two times a day (BID) | ORAL | 0 refills | Status: DC

## 2023-12-28 NOTE — Telephone Encounter (Signed)
 Pt aware that Vaginal swab was +Chlamydia and BV, will rx flagyl  and doxycycline, no sex or alcohol, while taking and needs proof of cure in 4 weeks, partner needs to be treated for chlamydia, can see is MD, or health or I can treat will need, name, dob,any allergies and drug store, [facility] sent.

## 2023-12-28 NOTE — Progress Notes (Signed)
 Vaginal swab was +Chlamydia and BV, will rx flagyl  and doxycycline, no sex or alcohol, while taking and needs proof of cure in 4 weeks, partner needs to be treated for chlamydia, can see is MD, or health or I can treat will need, name, dob,any allergies and drug store, [facility] sent.

## 2023-12-31 ENCOUNTER — Ambulatory Visit: Admitting: Women's Health

## 2024-01-22 ENCOUNTER — Ambulatory Visit (INDEPENDENT_AMBULATORY_CARE_PROVIDER_SITE_OTHER)

## 2024-01-22 ENCOUNTER — Other Ambulatory Visit (HOSPITAL_COMMUNITY)
Admission: RE | Admit: 2024-01-22 | Discharge: 2024-01-22 | Disposition: A | Discharge status: Home / Self Care | Source: Ambulatory Visit | Attending: Obstetrics & Gynecology | Admitting: Obstetrics & Gynecology

## 2024-01-22 DIAGNOSIS — Z113 Encounter for screening for infections with a predominantly sexual mode of transmission: Secondary | ICD-10-CM

## 2024-01-22 DIAGNOSIS — N760 Acute vaginitis: Secondary | ICD-10-CM | POA: Diagnosis not present

## 2024-01-22 NOTE — Progress Notes (Signed)
   NURSE VISIT- VAGINITIS/STD  SUBJECTIVE:  Brittany Morton is a 23 y.o. [id number] GYN patientfemale here for a vaginal swab for vaginitis screening, STD screen.  She reports the following symptoms: none for 0 days. Denies abnormal vaginal bleeding, significant pelvic pain, fever, or UTI symptoms. Patient denies symptoms or changes in sexual partners, just want to be tested.   OBJECTIVE:  There were no vitals taken for this visit.  Appears well, in no apparent distress  ASSESSMENT: Vaginal swab for vaginitis screening  PLAN: Self-collected vaginal probe for Gonorrhea, Chlamydia, Trichomonas, Bacterial Vaginosis, Yeast sent to lab Treatment: to be determined once results are received Follow-up as needed if symptoms persist/worsen, or new symptoms develop  Alyssa Jumper  01/22/2024 8:41 AM

## 2024-01-25 LAB — CERVICOVAGINAL ANCILLARY ONLY
Bacterial Vaginitis (gardnerella): POSITIVE — AB
Candida Glabrata: NEGATIVE
Candida Vaginitis: NEGATIVE
Chlamydia: NEGATIVE
Comment: NEGATIVE
Comment: NEGATIVE
Comment: NEGATIVE
Comment: NEGATIVE
Comment: NEGATIVE
Comment: NORMAL
Neisseria Gonorrhea: NEGATIVE
Trichomonas: NEGATIVE

## 2024-01-26 ENCOUNTER — Ambulatory Visit: Payer: Self-pay | Admitting: Adult Health

## 2024-01-26 MED ORDER — METRONIDAZOLE 500 MG PO TABS: 500.0000 mg | ORAL_TABLET | Freq: Two times a day (BID) | ORAL | 0 refills | Status: DC

## 2024-01-26 MED ORDER — METRONIDAZOLE 500 MG PO TABS: 500.0000 mg | ORAL_TABLET | Freq: Two times a day (BID) | ORAL | 0 refills | Status: AC

## 2024-02-19 ENCOUNTER — Ambulatory Visit
Admission: EM | Admit: 2024-02-19 | Discharge: 2024-02-19 | Disposition: A | Discharge status: Home / Self Care | Attending: Nurse Practitioner | Admitting: Nurse Practitioner

## 2024-02-19 ENCOUNTER — Telehealth: Payer: Self-pay | Admitting: Emergency Medicine

## 2024-02-19 ENCOUNTER — Encounter: Payer: Self-pay | Admitting: Emergency Medicine

## 2024-02-19 ENCOUNTER — Other Ambulatory Visit: Payer: Self-pay

## 2024-02-19 DIAGNOSIS — H5711 Ocular pain, right eye: Secondary | ICD-10-CM

## 2024-02-19 DIAGNOSIS — S0591XA Unspecified injury of right eye and orbit, initial encounter: Secondary | ICD-10-CM | POA: Diagnosis not present

## 2024-02-19 DIAGNOSIS — H5789 Other specified disorders of eye and adnexa: Secondary | ICD-10-CM | POA: Diagnosis not present

## 2024-02-19 MED ORDER — OLOPATADINE HCL 0.2 % OP SOLN: OPHTHALMIC | 0 refills | Status: DC

## 2024-02-19 MED ORDER — SULFAMETHOXAZOLE-TRIMETHOPRIM 800-160 MG PO TABS: 1.0000 | ORAL_TABLET | Freq: Two times a day (BID) | ORAL | 0 refills | Status: AC

## 2024-02-19 MED ORDER — KETOROLAC TROMETHAMINE 30 MG/ML IJ SOLN
30.0000 mg | Freq: Once | INTRAMUSCULAR | Status: AC
  Administered 2024-02-19: 30 mg via INTRAMUSCULAR

## 2024-02-19 MED ORDER — POLYMYXIN B-TRIMETHOPRIM 10000-0.1 UNIT/ML-% OP SOLN: 1.0000 [drp] | Freq: Four times a day (QID) | OPHTHALMIC | 0 refills | Status: AC

## 2024-02-19 MED ORDER — OLOPATADINE HCL 0.2 % OP SOLN: OPHTHALMIC | 0 refills | Status: AC

## 2024-02-19 MED ORDER — POLYMYXIN B-TRIMETHOPRIM 10000-0.1 UNIT/ML-% OP SOLN: 1.0000 [drp] | Freq: Four times a day (QID) | OPHTHALMIC | 0 refills | Status: DC

## 2024-02-19 MED ORDER — SULFAMETHOXAZOLE-TRIMETHOPRIM 800-160 MG PO TABS: 1.0000 | ORAL_TABLET | Freq: Two times a day (BID) | ORAL | 0 refills | Status: DC

## 2024-02-19 NOTE — Telephone Encounter (Signed)
 Pt requested prescriptions be sent in to [facility]  due to [facility] being closed for holiday.
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
## 2024-02-19 NOTE — Discharge Instructions (Addendum)
 Apply medication as prescribed. As discussed, you will need to take only Tylenol  for the remainder of the day.  You were given an injection of Toradol  30 mg at your appointment today. Warm compresses to the affected eye help with comfort.  Apply cool compresses to help with pain or swelling. Do not rub or manipulate the eye while symptoms persist. You need to wear sunglasses to help with light sensitivity. As discussed, if you are unable to get in with your eye doctor, please call [facility] to schedule an appointment for Monday, February 22, 2024.  Follow-up if symptoms do not improve.

## 2024-02-19 NOTE — ED Notes (Signed)
 Pt declines visual acuity, reports not able to see anything out of right eye. Denies requiring any corrective lenses at baseline.

## 2024-06-20 DIAGNOSIS — L03019 Cellulitis of unspecified finger: Secondary | ICD-10-CM | POA: Diagnosis not present
